# Patient Record
Sex: Female | Born: 2000 | Race: Black or African American | Hispanic: No | Marital: Single | Smoking: Never smoker
Health system: Southern US, Community
[De-identification: ages and names within clinical notes are randomized; demographics above are authoritative.]

## PROBLEM LIST (undated history)

## (undated) HISTORY — PX: NO PAST SURGERIES: SHX2092

## (undated) HISTORY — PX: WISDOM TOOTH EXTRACTION: SHX21

---

## 2019-08-24 ENCOUNTER — Encounter (HOSPITAL_COMMUNITY): Payer: Self-pay

## 2019-08-24 ENCOUNTER — Emergency Department (HOSPITAL_COMMUNITY)
Admission: EM | Admit: 2019-08-24 | Discharge: 2019-08-24 | Disposition: A | Discharge status: Home / Self Care | Payer: Medicaid Other | Attending: Emergency Medicine | Admitting: Emergency Medicine

## 2019-08-24 ENCOUNTER — Other Ambulatory Visit: Payer: Self-pay

## 2019-08-24 ENCOUNTER — Emergency Department (HOSPITAL_COMMUNITY): Payer: Medicaid Other

## 2019-08-24 DIAGNOSIS — Y999 Unspecified external cause status: Secondary | ICD-10-CM | POA: Insufficient documentation

## 2019-08-24 DIAGNOSIS — T148XXA Other injury of unspecified body region, initial encounter: Secondary | ICD-10-CM

## 2019-08-24 DIAGNOSIS — M7918 Myalgia, other site: Secondary | ICD-10-CM | POA: Diagnosis present

## 2019-08-24 DIAGNOSIS — Y9389 Activity, other specified: Secondary | ICD-10-CM | POA: Diagnosis not present

## 2019-08-24 DIAGNOSIS — Y92410 Unspecified street and highway as the place of occurrence of the external cause: Secondary | ICD-10-CM | POA: Insufficient documentation

## 2019-08-24 DIAGNOSIS — M25531 Pain in right wrist: Secondary | ICD-10-CM | POA: Diagnosis not present

## 2019-08-24 MED ORDER — NAPROXEN 500 MG PO TABS: 500.0000 mg | ORAL_TABLET | Freq: Two times a day (BID) | ORAL | 0 refills | Status: DC

## 2019-08-24 MED ORDER — IBUPROFEN 800 MG PO TABS
800.0000 mg | ORAL_TABLET | Freq: Once | ORAL | Status: AC
  Administered 2019-08-24: 800 mg via ORAL
  Filled 2019-08-24: qty 1

## 2019-08-24 NOTE — Discharge Instructions (Signed)
Take naproxen 2 times a day with meals.  Do not take other anti-inflammatories at the same time (Advil, Motrin, ibuprofen, Aleve). You may supplement with Tylenol if you need further pain control. Use muscle cream such as Salonpas, icy hot, BenGay, Biofreeze to help with muscle stiffness and pain. Use ice packs or heating pads if this helps control your pain. You will likely have continued muscle stiffness and soreness over the next couple days.  Follow-up with primary care in 1 week if your symptoms are not improving. Return to the emergency room if you develop vision changes, vomiting, slurred speech, numbness, loss of bowel or bladder control, or any new or worsening symptoms.
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
## 2019-10-04 ENCOUNTER — Other Ambulatory Visit: Payer: Self-pay

## 2019-10-04 ENCOUNTER — Ambulatory Visit (HOSPITAL_COMMUNITY)
Admission: EM | Admit: 2019-10-04 | Discharge: 2019-10-04 | Disposition: A | Discharge status: Home / Self Care | Payer: Medicaid Other | Attending: Family Medicine | Admitting: Family Medicine

## 2019-10-04 ENCOUNTER — Encounter (HOSPITAL_COMMUNITY): Payer: Self-pay | Admitting: Emergency Medicine

## 2019-10-04 DIAGNOSIS — M542 Cervicalgia: Secondary | ICD-10-CM | POA: Diagnosis not present

## 2019-10-04 DIAGNOSIS — M25531 Pain in right wrist: Secondary | ICD-10-CM | POA: Diagnosis not present

## 2019-10-04 MED ORDER — TIZANIDINE HCL 4 MG PO TABS: 4.0000 mg | ORAL_TABLET | Freq: Four times a day (QID) | ORAL | 0 refills | Status: DC | PRN

## 2019-10-04 MED ORDER — IBUPROFEN 800 MG PO TABS: 800.0000 mg | ORAL_TABLET | Freq: Three times a day (TID) | ORAL | 0 refills | Status: DC
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
## 2019-10-04 NOTE — Discharge Instructions (Signed)
Warmth to painful areas Take ibuprofen 3 times a day with food This is an anti-inflammatory pain medication Activity as tolerated Take tizanidine as needed muscle relaxer.  This is useful at bedtime follow-up with orthopedic
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
## 2019-10-14 ENCOUNTER — Ambulatory Visit (HOSPITAL_COMMUNITY)
Admission: EM | Admit: 2019-10-14 | Discharge: 2019-10-14 | Disposition: A | Discharge status: Home / Self Care | Payer: Medicaid Other

## 2019-10-14 ENCOUNTER — Encounter (HOSPITAL_COMMUNITY): Payer: Self-pay

## 2019-10-14 ENCOUNTER — Other Ambulatory Visit: Payer: Self-pay

## 2019-10-14 ENCOUNTER — Ambulatory Visit (INDEPENDENT_AMBULATORY_CARE_PROVIDER_SITE_OTHER): Payer: Medicaid Other

## 2019-10-14 DIAGNOSIS — M79601 Pain in right arm: Secondary | ICD-10-CM | POA: Diagnosis not present

## 2019-10-14 DIAGNOSIS — Z3202 Encounter for pregnancy test, result negative: Secondary | ICD-10-CM

## 2019-10-14 DIAGNOSIS — M79631 Pain in right forearm: Secondary | ICD-10-CM | POA: Diagnosis not present

## 2019-10-14 DIAGNOSIS — M79641 Pain in right hand: Secondary | ICD-10-CM | POA: Diagnosis not present

## 2019-10-14 LAB — POCT PREGNANCY, URINE: Preg Test, Ur: NEGATIVE

## 2019-10-14 LAB — POC URINE PREG, ED: Preg Test, Ur: NEGATIVE
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
## 2019-10-14 NOTE — Discharge Instructions (Signed)
Take the ibuprofen as prescribed.  Rest and elevate your arm.  Apply ice packs 2-3 times a day for up to 20 minutes each.  Wear the Ace wrap as needed for comfort.    Follow up with your primary care provider or an orthopedist if you symptoms continue or worsen;  Or if you develop new symptoms, such as numbness, tingling, or weakness.

## 2019-12-26 ENCOUNTER — Ambulatory Visit (HOSPITAL_COMMUNITY)
Admission: EM | Admit: 2019-12-26 | Discharge: 2019-12-26 | Disposition: A | Discharge status: Home / Self Care | Payer: Medicaid Other | Attending: Family Medicine | Admitting: Family Medicine

## 2019-12-26 ENCOUNTER — Encounter (HOSPITAL_COMMUNITY): Payer: Self-pay

## 2019-12-26 ENCOUNTER — Other Ambulatory Visit: Payer: Self-pay

## 2019-12-26 DIAGNOSIS — R197 Diarrhea, unspecified: Secondary | ICD-10-CM

## 2019-12-26 DIAGNOSIS — B354 Tinea corporis: Secondary | ICD-10-CM

## 2019-12-26 DIAGNOSIS — M2580 Other specified joint disorders, unspecified joint: Secondary | ICD-10-CM

## 2019-12-26 DIAGNOSIS — L2084 Intrinsic (allergic) eczema: Secondary | ICD-10-CM

## 2019-12-26 MED ORDER — OMEPRAZOLE 20 MG PO CPDR: 20.0000 mg | DELAYED_RELEASE_CAPSULE | Freq: Every day | ORAL | 3 refills | Status: DC

## 2019-12-26 MED ORDER — KETOCONAZOLE 2 % EX CREA: 1.0000 "application " | TOPICAL_CREAM | Freq: Two times a day (BID) | CUTANEOUS | 0 refills | Status: DC

## 2019-12-26 MED ORDER — TRIAMCINOLONE ACETONIDE 0.1 % EX CREA: 1.0000 "application " | TOPICAL_CREAM | Freq: Two times a day (BID) | CUTANEOUS | 3 refills | Status: DC

## 2019-12-26 NOTE — ED Triage Notes (Addendum)
Pt presents to UC with abdominal pain and diarrhea since last after she ate form [employer]. Pt reports she may have ringworm in her left arm.

## 2019-12-26 NOTE — ED Provider Notes (Signed)
[facility]    CSN: [id number] Arrival date & time: 12/26/19  1547      History   Chief Complaint Chief Complaint  Patient presents with  . Abdominal Pain  . Diarrhea    HPI Anna Davila is a 19 y.o. female.   [facility] patient visit  Patient comes in complaining of abdominal pain and diarrhea.   Pt presents to UC with abdominal pain and diarrhea since last after she ate form [employer].   No diarrhea x 24 hours.  No cramps or vomiting.  Mild mid abdominal ache.  Pt reports she may have ringworm in her left arm.  This is recurrent.  Began after exposure to pets several days ago    She also would like a refill on her triamcinolone cream for eczema between fingers.  Also, she has tender ball of right great toe x 24 hours.  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  This is a new problem.     History reviewed. No pertinent past medical history.  There are no problems to display for this patient.   History reviewed. No pertinent surgical history.  OB History   No obstetric history on file.      Home Medications    Prior to Admission medications   Medication Sig Start Date End Date Taking? Authorizing Provider  Cholecalciferol 1.25 MG (50000 UT) capsule Take by mouth. 03/17/18   [provider]  ibuprofen (ADVIL) 800 MG tablet Take 1 tablet (800 mg total) by mouth 3 (three) times daily. 10/04/19   Eustace Moore, MD  ketoconazole (NIZORAL) 2 % cream Apply 1 application topically 2 (two) times daily. For ringworm 12/26/19   Elvina Sidle, MD  omeprazole (PRILOSEC) 20 MG capsule Take 1 capsule (20 mg total) by mouth daily. 12/26/19   Elvina Sidle, MD  tiZANidine (ZANAFLEX) 4 MG tablet Take 1-2 tablets (4-8 mg total) by mouth every 6 (six) hours as needed for muscle spasms. 10/04/19   Eustace Moore, MD  triamcinolone cream (KENALOG) 0.1 % Apply 1 application topically 2 (two) times daily. 12/26/19   Elvina Sidle, MD     Family History Family History  Problem Relation Age of Onset  . Healthy Mother   . Hypertension Father     Social History Social History   Tobacco Use  . Smoking status: Never Smoker  . Smokeless tobacco: Never Used  Substance Use Topics  . Alcohol use: Never  . Drug use: Never     Allergies   Shellfish-derived products   Review of Systems Review of Systems  Constitutional: Positive for fatigue. Negative for fever.  HENT: Negative.   Respiratory: Negative.   Cardiovascular: Negative.   Gastrointestinal: Positive for abdominal pain and diarrhea. Negative for nausea and vomiting.  Musculoskeletal: Positive for gait problem.  Skin: Positive for rash.  Neurological: Negative for dizziness and headaches.     Physical Exam Triage Vital Signs ED Triage Vitals [12/26/19 1604]  Enc Vitals Group     BP      Pulse      Resp      Temp      Temp src      SpO2      Weight      Height      Head Circumference      Peak Flow      Pain Score 7     Pain Loc      Pain Edu?  Excl. in [place]?    No data found.  Updated Vital Signs BP 130/76 (BP Location: Left Arm)   Pulse 72   Temp 98.8 F (37.1 C) (Oral)   Resp 18   LMP  (Within Weeks) Comment: 1 week  SpO2 100%   Physical Exam Vitals and nursing note reviewed.  Constitutional:      General: She is not in acute distress.    Appearance: She is well-developed and normal weight.  HENT:     Mouth/Throat:     Mouth: Mucous membranes are moist.  Eyes:     Extraocular Movements: Extraocular movements intact.  Cardiovascular:     Rate and Rhythm: Normal rate.  Pulmonary:     Effort: Pulmonary effort is normal.     Breath sounds: Normal breath sounds.  Abdominal:     General: Abdomen is flat. Bowel sounds are normal.     Palpations: Abdomen is soft.     Tenderness: There is no abdominal tenderness. There is no guarding or rebound.  Skin:    General: Skin is warm and dry.     Comments: 4 mm annular left  forearm papule with central clearing  Dry knuckles on both hands  FROM right foot without erythema but the ball of the foot is tender and pain is reproduced by forced extension of the great toe.  Neurological:     General: No focal deficit present.     Mental Status: She is alert.  Psychiatric:        Mood and Affect: Mood normal.      UC Treatments / Results  Labs (all labs ordered are listed, but only abnormal results are displayed) Labs Reviewed - No data to display  EKG   Radiology No results found.  Procedures Procedures (including critical care time)  Medications Ordered in UC Medications - No data to display  Initial Impression / Assessment and Plan / UC Course  I have reviewed the triage vital signs and the nursing notes.  Pertinent labs & imaging results that were available during my care of the patient were reviewed by me and considered in my medical decision making (see chart for details).     Final Clinical Impressions(s) / UC Diagnoses   Final diagnoses:  Diarrhea, unspecified type  Sesamoiditis  Intrinsic eczema  Tinea corporis     Discharge Instructions     Avoid dairy and fried foods for next day or two.    ED Prescriptions    Medication Sig Dispense Auth. Provider   triamcinolone cream (KENALOG) 0.1 % Apply 1 application topically 2 (two) times daily. 80 g Robyn Haber, MD   ketoconazole (NIZORAL) 2 % cream Apply 1 application topically 2 (two) times daily. For ringworm 30 g Robyn Haber, MD   omeprazole (PRILOSEC) 20 MG capsule Take 1 capsule (20 mg total) by mouth daily. 7 capsule Robyn Haber, MD     I have reviewed the PDMP during this encounter.   Robyn Haber, MD 12/26/19 [phone]

## 2019-12-26 NOTE — Discharge Instructions (Addendum)
Avoid dairy and fried foods for next day or two.

## 2020-05-20 ENCOUNTER — Other Ambulatory Visit: Payer: Self-pay

## 2020-05-20 ENCOUNTER — Ambulatory Visit (HOSPITAL_COMMUNITY)
Admission: EM | Admit: 2020-05-20 | Discharge: 2020-05-20 | Disposition: A | Discharge status: Home / Self Care | Payer: Medicaid Other | Attending: Family Medicine | Admitting: Family Medicine

## 2020-05-20 ENCOUNTER — Encounter (HOSPITAL_COMMUNITY): Payer: Self-pay

## 2020-05-20 DIAGNOSIS — S161XXA Strain of muscle, fascia and tendon at neck level, initial encounter: Secondary | ICD-10-CM | POA: Diagnosis not present

## 2020-05-20 MED ORDER — DICLOFENAC SODIUM 75 MG PO TBEC: 75.0000 mg | DELAYED_RELEASE_TABLET | Freq: Two times a day (BID) | ORAL | 0 refills | Status: DC

## 2020-05-20 MED ORDER — CYCLOBENZAPRINE HCL 10 MG PO TABS: ORAL_TABLET | ORAL | 0 refills | Status: DC
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
## 2020-05-29 ENCOUNTER — Ambulatory Visit (INDEPENDENT_AMBULATORY_CARE_PROVIDER_SITE_OTHER): Payer: Medicaid Other

## 2020-05-29 ENCOUNTER — Encounter (HOSPITAL_COMMUNITY): Payer: Self-pay

## 2020-05-29 ENCOUNTER — Ambulatory Visit (HOSPITAL_COMMUNITY)
Admission: EM | Admit: 2020-05-29 | Discharge: 2020-05-29 | Disposition: A | Discharge status: Home / Self Care | Payer: Medicaid Other | Attending: Family Medicine | Admitting: Family Medicine

## 2020-05-29 ENCOUNTER — Other Ambulatory Visit: Payer: Self-pay

## 2020-05-29 DIAGNOSIS — M25512 Pain in left shoulder: Secondary | ICD-10-CM

## 2020-05-29 MED ORDER — NAPROXEN 500 MG PO TABS: 500.0000 mg | ORAL_TABLET | Freq: Two times a day (BID) | ORAL | 0 refills | Status: DC
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
## 2020-05-29 NOTE — Discharge Instructions (Signed)
Use ice to heat to area Wear sling for a few days ( no more than 3) Remove sling to do exercises for motion 2 x a day See sports medicine if not improving by next week

## 2020-08-20 ENCOUNTER — Encounter: Payer: Self-pay | Admitting: Internal Medicine

## 2020-08-20 ENCOUNTER — Telehealth: Payer: Medicaid Other | Admitting: Internal Medicine

## 2020-08-20 ENCOUNTER — Other Ambulatory Visit: Payer: Self-pay

## 2020-08-20 ENCOUNTER — Ambulatory Visit: Payer: BLUE CROSS/BLUE SHIELD | Attending: Internal Medicine | Admitting: Internal Medicine

## 2020-08-20 VITALS — BP 119/71 | HR 67 | Temp 98.7°F | Resp 16 | Ht 68.0 in | Wt 220.8 lb

## 2020-08-20 DIAGNOSIS — Z79899 Other long term (current) drug therapy: Secondary | ICD-10-CM | POA: Diagnosis not present

## 2020-08-20 DIAGNOSIS — Z791 Long term (current) use of non-steroidal anti-inflammatories (NSAID): Secondary | ICD-10-CM | POA: Insufficient documentation

## 2020-08-20 DIAGNOSIS — G8929 Other chronic pain: Secondary | ICD-10-CM | POA: Diagnosis present

## 2020-08-20 DIAGNOSIS — S161XXA Strain of muscle, fascia and tendon at neck level, initial encounter: Secondary | ICD-10-CM | POA: Insufficient documentation

## 2020-08-20 DIAGNOSIS — Z2821 Immunization not carried out because of patient refusal: Secondary | ICD-10-CM | POA: Diagnosis not present

## 2020-08-20 DIAGNOSIS — M25512 Pain in left shoulder: Secondary | ICD-10-CM | POA: Diagnosis not present

## 2020-08-20 MED ORDER — CYCLOBENZAPRINE HCL 5 MG PO TABS: 5.0000 mg | ORAL_TABLET | Freq: Every evening | ORAL | 1 refills | Status: DC | PRN

## 2020-08-20 MED ORDER — DICLOFENAC SODIUM 1 % EX GEL: 2.0000 g | Freq: Four times a day (QID) | CUTANEOUS | 1 refills | Status: DC

## 2020-08-20 MED ORDER — IBUPROFEN 800 MG PO TABS: 800.0000 mg | ORAL_TABLET | Freq: Three times a day (TID) | ORAL | 1 refills | Status: DC | PRN
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
## 2020-08-20 NOTE — Patient Instructions (Signed)
Try to adjust your workstation to appropriate height so that you are not having to flex your neck and upper back.  Use a heating pad as needed in the evenings.  It would help to loosen the muscles in the upper back.  I have prescribed a muscle relaxant called Flexeril to use as needed.  It can cause some drowsiness.  Do not take it when you have to drive or operate any machinery.

## 2020-11-25 ENCOUNTER — Ambulatory Visit (HOSPITAL_COMMUNITY)
Admission: EM | Admit: 2020-11-25 | Discharge: 2020-11-25 | Disposition: A | Discharge status: Home / Self Care | Payer: Medicaid Other | Attending: Family Medicine | Admitting: Family Medicine

## 2020-11-25 ENCOUNTER — Encounter (HOSPITAL_COMMUNITY): Payer: Self-pay | Admitting: Emergency Medicine

## 2020-11-25 ENCOUNTER — Other Ambulatory Visit: Payer: Self-pay

## 2020-11-25 DIAGNOSIS — G5701 Lesion of sciatic nerve, right lower limb: Secondary | ICD-10-CM

## 2020-11-25 DIAGNOSIS — S161XXA Strain of muscle, fascia and tendon at neck level, initial encounter: Secondary | ICD-10-CM

## 2020-11-25 DIAGNOSIS — R110 Nausea: Secondary | ICD-10-CM

## 2020-11-25 DIAGNOSIS — M25512 Pain in left shoulder: Secondary | ICD-10-CM

## 2020-11-25 DIAGNOSIS — G8929 Other chronic pain: Secondary | ICD-10-CM

## 2020-11-25 LAB — POCT URINALYSIS DIPSTICK, ED / UC
Bilirubin Urine: NEGATIVE
Glucose, UA: NEGATIVE mg/dL
Ketones, ur: NEGATIVE mg/dL
Leukocytes,Ua: NEGATIVE
Nitrite: NEGATIVE
Protein, ur: 30 mg/dL — AB
Specific Gravity, Urine: 1.01 (ref 1.005–1.030)
Urobilinogen, UA: 0.2 mg/dL (ref 0.0–1.0)
pH: 5.5 (ref 5.0–8.0)

## 2020-11-25 LAB — POC URINE PREG, ED: Preg Test, Ur: NEGATIVE

## 2020-11-25 MED ORDER — CYCLOBENZAPRINE HCL 5 MG PO TABS: 5.0000 mg | ORAL_TABLET | Freq: Every evening | ORAL | 0 refills | Status: DC | PRN

## 2020-11-25 MED ORDER — ONDANSETRON 4 MG PO TBDP: 4.0000 mg | ORAL_TABLET | Freq: Three times a day (TID) | ORAL | 0 refills | Status: DC | PRN

## 2020-11-25 MED ORDER — PREDNISONE 20 MG PO TABS: 40.0000 mg | ORAL_TABLET | Freq: Every day | ORAL | 0 refills | Status: DC

## 2020-11-25 NOTE — ED Triage Notes (Signed)
Pt presents today with c/o of right lower back pain that radiates down right buttocks x 2 days. +nausea

## 2020-11-26 NOTE — ED Provider Notes (Signed)
[facility]    CSN: [id number] Arrival date & time: 11/25/20  1853      History   Chief Complaint Chief Complaint  Patient presents with  . Back Pain  . Nausea    HPI Anna Davila is a 20 y.o. female.   Patient presenting today with 1 day history of right low back/hip pain that radiates part way down the back of her right leg.  She states it feels kind of like a shooting pain when she tries to walk and an ache when she is still.  She states this started after she stepped down from a small step wrong at work yesterday.  The area feels very stiff and tight.  No numbness, tingling, swelling, bowel or bladder incontinence, saddle paresthesias.  She also states she ate some chicken wings at work last night and now has some nausea since waking up this morning and has had no appetite.  She has not vomited and has not had any diarrhea at this time.  Denies abdominal pain.  No urinary symptoms or vaginal symptoms.  Has not been trying anything over-the-counter for any of the symptoms.     History reviewed. No pertinent past medical history.  Patient Active Problem List   Diagnosis Date Noted  . Influenza vaccine refused 08/20/2020  . Chronic left shoulder pain 08/20/2020  . Cervical strain 08/20/2020    Past Surgical History:  Procedure Laterality Date  . NO PAST SURGERIES      OB History   No obstetric history on file.      Home Medications    Prior to Admission medications   Medication Sig Start Date End Date Taking? Authorizing Provider  ondansetron (ZOFRAN ODT) 4 MG disintegrating tablet Take 1 tablet (4 mg total) by mouth every 8 (eight) hours as needed for nausea or vomiting. 11/25/20  Yes Particia Nearing, PA-C  predniSONE (DELTASONE) 20 MG tablet Take 2 tablets (40 mg total) by mouth daily with breakfast. 11/25/20  Yes Particia Nearing, PA-C  cyclobenzaprine (FLEXERIL) 5 MG tablet Take 1 tablet (5 mg total) by mouth at bedtime as needed for  muscle spasms. 11/25/20   Particia Nearing, PA-C  diclofenac Sodium (VOLTAREN) 1 % GEL Apply 2 g topically 4 (four) times daily. 08/20/20   Marcine Matar, MD  ibuprofen (ADVIL) 800 MG tablet Take 1 tablet (800 mg total) by mouth every 8 (eight) hours as needed (with food). 08/20/20   Marcine Matar, MD  omeprazole (PRILOSEC) 20 MG capsule Take 1 capsule (20 mg total) by mouth daily. 12/26/19 05/29/20  Elvina Sidle, MD    Family History Family History  Problem Relation Age of Onset  . Healthy Mother   . Hypertension Father     Social History Social History   Tobacco Use  . Smoking status: Never Smoker  . Smokeless tobacco: Never Used  Vaping Use  . Vaping Use: Never used  Substance Use Topics  . Alcohol use: Never  . Drug use: Never     Allergies   Shellfish-derived products   Review of Systems Review of Systems Per HPI Physical Exam Triage Vital Signs ED Triage Vitals  Enc Vitals Group     BP 11/25/20 1957 111/75     Pulse Rate 11/25/20 1957 75     Resp 11/25/20 1957 20     Temp 11/25/20 1957 98.1 F (36.7 C)     Temp Source 11/25/20 1957 Oral     SpO2 11/25/20  1957 100 %     Weight --      Height --      Head Circumference --      Peak Flow --      Pain Score 11/25/20 1953 7     Pain Loc --      Pain Edu? --      Excl. in GC? --    No data found.  Updated Vital Signs BP 111/75 (BP Location: Left Arm)   Pulse 75   Temp 98.1 F (36.7 C) (Oral)   Resp 20   LMP 11/19/2020 (Exact Date)   SpO2 100%   Visual Acuity Right Eye Distance:   Left Eye Distance:   Bilateral Distance:    Right Eye Near:   Left Eye Near:    Bilateral Near:     Physical Exam Vitals and nursing note reviewed.  Constitutional:      Appearance: Normal appearance. She is not ill-appearing.  HENT:     Head: Atraumatic.     Mouth/Throat:     Mouth: Mucous membranes are moist.     Pharynx: Oropharynx is clear.  Eyes:     Extraocular Movements: Extraocular  movements intact.     Conjunctiva/sclera: Conjunctivae normal.  Cardiovascular:     Rate and Rhythm: Normal rate and regular rhythm.     Heart sounds: Normal heart sounds.  Pulmonary:     Effort: Pulmonary effort is normal.     Breath sounds: Normal breath sounds.  Abdominal:     General: Bowel sounds are normal. There is no distension.     Palpations: Abdomen is soft.     Tenderness: There is no abdominal tenderness. There is no right CVA tenderness, left CVA tenderness or guarding.  Musculoskeletal:        General: Tenderness present. No swelling or deformity. Normal range of motion.     Cervical back: Normal range of motion and neck supple.     Comments: Mild right lateral lumbar paraspinal tenderness palpation extending down right lateral hip Negative straight leg raise bilateral lower extremities  Skin:    General: Skin is warm and dry.  Neurological:     Mental Status: She is alert and oriented to person, place, and time.     Comments: Bilateral lower extremities neurovascularly intact  Psychiatric:        Mood and Affect: Mood normal.        Thought Content: Thought content normal.        Judgment: Judgment normal.     UC Treatments / Results  Labs (all labs ordered are listed, but only abnormal results are displayed) Labs Reviewed  POCT URINALYSIS DIPSTICK, ED / UC - Abnormal; Notable for the following components:      Result Value   Hgb urine dipstick MODERATE (*)    Protein, ur 30 (*)    All other components within normal limits  POC URINE PREG, ED    EKG   Radiology No results found.  Procedures Procedures (including critical care time)  Medications Ordered in UC Medications - No data to display  Initial Impression / Assessment and Plan / UC Course  I have reviewed the triage vital signs and the nursing notes.  Pertinent labs & imaging results that were available during my care of the patient were reviewed by me and considered in my medical decision  making (see chart for details).     Suspect piriformis syndrome causing her back pain radiating down part  way to her right leg.  Will treat with prednisone burst, Flexeril, Epsom salt soaks, stretches, heat.  Work note given for some rest.  We will treat her nausea with Zofran for as needed use.  Brat diet, fluids reviewed.  UA, urine pregnancy both negative today.  Return for acutely worsening symptoms.  Final Clinical Impressions(s) / UC Diagnoses   Final diagnoses:  Piriformis syndrome of right side  Nausea   Discharge Instructions   None    ED Prescriptions    Medication Sig Dispense Auth. Provider   predniSONE (DELTASONE) 20 MG tablet Take 2 tablets (40 mg total) by mouth daily with breakfast. 6 tablet Particia Nearing, PA-C   cyclobenzaprine (FLEXERIL) 5 MG tablet Take 1 tablet (5 mg total) by mouth at bedtime as needed for muscle spasms. 15 tablet Particia Nearing, PA-C   ondansetron (ZOFRAN ODT) 4 MG disintegrating tablet Take 1 tablet (4 mg total) by mouth every 8 (eight) hours as needed for nausea or vomiting. 20 tablet Particia Nearing, [place]     PDMP not reviewed this encounter.   Particia Nearing, [place] 11/26/20 2035

## 2020-12-03 ENCOUNTER — Other Ambulatory Visit: Payer: Self-pay

## 2020-12-03 ENCOUNTER — Ambulatory Visit (HOSPITAL_COMMUNITY)
Admission: EM | Admit: 2020-12-03 | Discharge: 2020-12-03 | Disposition: A | Discharge status: Home / Self Care | Payer: Medicaid Other | Attending: Family | Admitting: Family

## 2020-12-03 ENCOUNTER — Encounter (HOSPITAL_COMMUNITY): Payer: Self-pay | Admitting: Emergency Medicine

## 2020-12-03 DIAGNOSIS — T148XXA Other injury of unspecified body region, initial encounter: Secondary | ICD-10-CM

## 2020-12-03 DIAGNOSIS — M7918 Myalgia, other site: Secondary | ICD-10-CM | POA: Diagnosis not present

## 2020-12-03 DIAGNOSIS — M545 Low back pain, unspecified: Secondary | ICD-10-CM

## 2020-12-03 MED ORDER — PREDNISONE 10 MG (21) PO TBPK: ORAL_TABLET | ORAL | 0 refills | Status: DC

## 2020-12-03 NOTE — ED Provider Notes (Signed)
[facility]    CSN: [id number] Arrival date & time: 12/03/20  8657      History   Chief Complaint Chief Complaint  Patient presents with  . Back Pain    HPI Anna Davila is a 20 y.o. female.   20 year old female presents for recheck on right low back and buttock pain that started over 1 week ago. She initially was standing at work on [employer] when she slightly twisted and felt pain in her lower back and buttock. She came to Urgent care on 11/25/20 and was diagnosed with Piriformis syndrome (essentially strain of muscle in buttock) and stayed out of work 2 days. She was placed on 3 days of Prednisone 40mg , Flexeril and Zofran for nausea due to pain. She finished the Prednisone and it was helping some. Has not taken the Flexeril the past 2 nights and having some difficulty sleeping. No longer needs the Zofran. Had used Voltaren gel in the past but not recently. Pain level now is similar to when she first started having pain- gets worse with standing still for hours at work. Better when moving around or sitting. Requests work restrictions for the next week to try to help decrease pain. No other chronic health issues.   The history is provided by the patient.    History reviewed. No pertinent past medical history.  Patient Active Problem List   Diagnosis Date Noted  . Influenza vaccine refused 08/20/2020  . Chronic left shoulder pain 08/20/2020  . Cervical strain 08/20/2020    Past Surgical History:  Procedure Laterality Date  . NO PAST SURGERIES      OB History   No obstetric history on file.      Home Medications    Prior to Admission medications   Medication Sig Start Date End Date Taking? Authorizing Provider  cyclobenzaprine (FLEXERIL) 5 MG tablet Take 1 tablet (5 mg total) by mouth at bedtime as needed for muscle spasms. 11/25/20  Yes 11/27/20, PA-C  predniSONE (STERAPRED UNI-PAK 21 TAB) 10 MG (21) TBPK tablet Take 6 tablets by mouth  today then decrease by 1 tablet each day until finished. 12/03/20  Yes Chi Garlow, [birth year], NP  diclofenac Sodium (VOLTAREN) 1 % GEL Apply 2 g topically 4 (four) times daily. 08/20/20   08/22/20, MD  omeprazole (PRILOSEC) 20 MG capsule Take 1 capsule (20 mg total) by mouth daily. 12/26/19 05/29/20  07/29/20, MD    Family History Family History  Problem Relation Age of Onset  . Healthy Mother   . Hypertension Father     Social History Social History   Tobacco Use  . Smoking status: Never Smoker  . Smokeless tobacco: Never Used  Vaping Use  . Vaping Use: Never used  Substance Use Topics  . Alcohol use: Never  . Drug use: Never     Allergies   Shellfish-derived products   Review of Systems Review of Systems  Constitutional: Positive for activity change. Negative for appetite change, chills, diaphoresis, fatigue and fever.  Respiratory: Negative for chest tightness, shortness of breath and stridor.   Cardiovascular: Negative for chest pain.  Gastrointestinal: Negative for nausea and vomiting.  Genitourinary: Negative for decreased urine volume, difficulty urinating, dysuria and flank pain.  Musculoskeletal: Positive for arthralgias, back pain and myalgias. Negative for gait problem.  Skin: Negative for color change and rash.  Allergic/Immunologic: Positive for environmental allergies and food allergies. Negative for immunocompromised state.  Neurological: Negative for  dizziness, tremors, seizures, syncope, facial asymmetry, speech difficulty, weakness, light-headedness, numbness and headaches.  Hematological: Negative for adenopathy. Does not bruise/bleed easily.  Psychiatric/Behavioral: Positive for sleep disturbance.     Physical Exam Triage Vital Signs ED Triage Vitals  Enc Vitals Group     BP 12/03/20 0822 118/79     Pulse Rate 12/03/20 0822 83     Resp 12/03/20 0822 20     Temp 12/03/20 0822 99 F (37.2 C)     Temp Source 12/03/20 0822 Oral     SpO2  12/03/20 0822 96 %     Weight --      Height --      Head Circumference --      Peak Flow --      Pain Score 12/03/20 0817 7     Pain Loc --      Pain Edu? --      Excl. in GC? --    No data found.  Updated Vital Signs BP 118/79 (BP Location: Left Arm)   Pulse 83   Temp 99 F (37.2 C) (Oral)   Resp 20   LMP 11/19/2020 (Exact Date)   SpO2 96%   Visual Acuity Right Eye Distance:   Left Eye Distance:   Bilateral Distance:    Right Eye Near:   Left Eye Near:    Bilateral Near:     Physical Exam Vitals and nursing note reviewed.  Constitutional:      General: She is awake. She is not in acute distress.    Appearance: She is well-developed and well-groomed.     Comments: She is sitting in the exam chair in no acute distress but is slow to change positions.   HENT:     Head: Normocephalic and atraumatic.     Right Ear: Hearing normal.     Left Ear: Hearing normal.  Eyes:     Extraocular Movements: Extraocular movements intact.     Conjunctiva/sclera: Conjunctivae normal.  Cardiovascular:     Rate and Rhythm: Normal rate and regular rhythm.     Heart sounds: Normal heart sounds. No murmur heard.   Pulmonary:     Effort: Pulmonary effort is normal. No respiratory distress.     Breath sounds: Normal breath sounds and air entry. No decreased air movement. No decreased breath sounds, wheezing, rhonchi or rales.  Musculoskeletal:        General: Tenderness present. No swelling.     Cervical back: Normal range of motion.     Thoracic back: Normal.     Lumbar back: Tenderness present. No swelling or edema. Decreased range of motion. Positive right straight leg raise test. Negative left straight leg raise test. No scoliosis.       Back:     Comments: Decreased range of motion of lower back- mostly with extension and some rotation. No swelling, redness or rash. Slightly tender on right lower lumbar area and upper to mid buttock area. No radiation of pain. No numbness. Good  reflexes and distal pulses. No neuro deficits detected.   Skin:    General: Skin is warm and dry.     Capillary Refill: Capillary refill takes less than 2 seconds.     Findings: No erythema or rash.  Neurological:     General: No focal deficit present.     Mental Status: She is alert and oriented to person, place, and time.     Sensory: Sensation is intact. No sensory deficit.  Motor: Motor function is intact.     Gait: Gait is intact.     Deep Tendon Reflexes: Reflexes are normal and symmetric.  Psychiatric:        Mood and Affect: Mood normal.        Behavior: Behavior normal. Behavior is cooperative.        Thought Content: Thought content normal.        Judgment: Judgment normal.      UC Treatments / Results  Labs (all labs ordered are listed, but only abnormal results are displayed) Labs Reviewed - No data to display  EKG   Radiology No results found.  Procedures Procedures (including critical care time)  Medications Ordered in UC Medications - No data to display  Initial Impression / Assessment and Plan / UC Course  I have reviewed the triage vital signs and the nursing notes.  Pertinent labs & imaging results that were available during my care of the patient were reviewed by me and considered in my medical decision making (see chart for details).    Reviewed with patient that muscle strains can take 2 to 3 weeks to improve and resolve. Discussed that since Prednisone did help some last week, recommend restart Prednisone 10mg  6 day dose pack as directed. Encouraged to take Flexeril 10mg  1/2 or 1 whole tablet at night to help with muscle spasms/pain. Continue to apply heat to area for comfort. May also use OTC Voltaren gel 4 times a day as needed. Continue to stay active- no bed rest. Note written for work with restrictions including no standing for more than 15 minutes at a time, encouraged to sit along with frequent changing of positions. No twisting or reaching  on right side- all for 1 week. Recommend follow-up with her PCP next week (4 to 5 days) if not improving for further evaluation and to complete any other work-related paperwork if needed.  Final Clinical Impressions(s) / UC Diagnoses   Final diagnoses:  Acute right-sided low back pain without sciatica  Right buttock pain  Muscle strain     Discharge Instructions     Recommend restart Prednisone 10mg  tablets- take 6 tablets today then decrease by 1 tablet until finished. May continue Flexeril 10mg  1/2 to 1 whole tablet at night as needed for muscle spasms. Continue to apply heat to area for comfort or may also try OTC Generic Voltaren gel- apply up to 4 times a day as needed to area. Continue to stay active- no bed rest. Follow-up with your PCP if pain continues next week.      ED Prescriptions    Medication Sig Dispense Auth. Provider   predniSONE (STERAPRED UNI-PAK 21 TAB) 10 MG (21) TBPK tablet Take 6 tablets by mouth today then decrease by 1 tablet each day until finished. 21 tablet Jacqulin Brandenburger, , NP     PDMP not reviewed this encounter.   , NP 12/04/20 1110

## 2020-12-03 NOTE — Discharge Instructions (Addendum)
Recommend restart Prednisone 10mg  tablets- take 6 tablets today then decrease by 1 tablet until finished. May continue Flexeril 10mg  1/2 to 1 whole tablet at night as needed for muscle spasms. Continue to apply heat to area for comfort or may also try OTC Generic Voltaren gel- apply up to 4 times a day as needed to area. Continue to stay active- no bed rest. Follow-up with your PCP if pain continues next week.

## 2020-12-03 NOTE — ED Triage Notes (Signed)
Seen 11/25/2020 for the same.  Hurting lower right side of back and right buttocks.  No radiation.  Pain did seem to improve minimally with prednisone, but today's pain is the same as when seen

## 2020-12-29 ENCOUNTER — Telehealth: Payer: Medicaid Other | Admitting: Internal Medicine

## 2021-02-25 ENCOUNTER — Other Ambulatory Visit (HOSPITAL_COMMUNITY)
Admission: RE | Admit: 2021-02-25 | Discharge: 2021-02-25 | Disposition: A | Discharge status: Home / Self Care | Payer: Medicaid Other | Source: Ambulatory Visit | Attending: Internal Medicine | Admitting: Internal Medicine

## 2021-02-25 ENCOUNTER — Other Ambulatory Visit: Payer: Self-pay

## 2021-02-25 ENCOUNTER — Encounter: Payer: Self-pay | Admitting: Internal Medicine

## 2021-02-25 ENCOUNTER — Ambulatory Visit: Payer: Medicaid Other | Attending: Internal Medicine | Admitting: Internal Medicine

## 2021-02-25 VITALS — BP 124/70 | HR 81 | Resp 16 | Wt 227.8 lb

## 2021-02-25 DIAGNOSIS — Z6834 Body mass index (BMI) 34.0-34.9, adult: Secondary | ICD-10-CM | POA: Insufficient documentation

## 2021-02-25 DIAGNOSIS — Z91018 Allergy to other foods: Secondary | ICD-10-CM

## 2021-02-25 DIAGNOSIS — Z113 Encounter for screening for infections with a predominantly sexual mode of transmission: Secondary | ICD-10-CM | POA: Diagnosis present

## 2021-02-25 DIAGNOSIS — E66811 Obesity, class 1: Secondary | ICD-10-CM | POA: Insufficient documentation

## 2021-02-25 DIAGNOSIS — L309 Dermatitis, unspecified: Secondary | ICD-10-CM

## 2021-02-25 DIAGNOSIS — E669 Obesity, unspecified: Secondary | ICD-10-CM | POA: Diagnosis not present

## 2021-02-25 DIAGNOSIS — Z713 Dietary counseling and surveillance: Secondary | ICD-10-CM | POA: Insufficient documentation

## 2021-02-25 DIAGNOSIS — L2089 Other atopic dermatitis: Secondary | ICD-10-CM | POA: Insufficient documentation

## 2021-02-25 MED ORDER — TRIAMCINOLONE ACETONIDE 0.1 % EX CREA: 1.0000 "application " | TOPICAL_CREAM | Freq: Two times a day (BID) | CUTANEOUS | 2 refills | Status: DC

## 2021-02-25 MED ORDER — EPINEPHRINE 0.3 MG/0.3ML IJ SOAJ: 0.3000 mg | INTRAMUSCULAR | 1 refills | Status: AC | PRN

## 2021-02-25 NOTE — Patient Instructions (Signed)

## 2021-02-25 NOTE — Progress Notes (Signed)
Patient ID: Anna Davila, female    DOB: 10/20/2000  MRN: [id number]  CC: std testing, Eczema, Allergy Testing (seafood), and Weight Loss   Subjective: Anna Davila is a 20 y.o. female who presents to discuss several concerns Her concerns today include:   Wants to be screen for STD. No vaginal discgh at this time.  Sexually active with one female partner.  Does not use condoms.    Eczema: dx with eczema in high school.  Currently has breakout on RT hand, anterior chest and LT cubital fossa.  She was using the cream in the past but does not recall the name of it.  She was not using it consistently.  Uses Vaseline consistently.  Wants to be tested for sea food allergy.  Gets itchy throat when she eats fish and shellfish.  Last yr she had episode where she developed swelling of throat and face after eating food that was on display at a buffet right next to seafood..  She did not go to the emergency room.  Instead she took some antihistamine immediately and symptoms resolved.      Obesity: Would like to get her weight down.  Working out 2x/wk over the past mth. Trying to do better with eating habits and amount that she eats.  However she feels that her efforts are not working.  Patient Active Problem List   Diagnosis Date Noted   Influenza vaccine refused 08/20/2020   Chronic left shoulder pain 08/20/2020   Cervical strain 08/20/2020     Current Outpatient Medications on File Prior to Visit  Medication Sig Dispense Refill   cyclobenzaprine (FLEXERIL) 5 MG tablet Take 1 tablet (5 mg total) by mouth at bedtime as needed for muscle spasms. 15 tablet 0   diclofenac Sodium (VOLTAREN) 1 % GEL Apply 2 g topically 4 (four) times daily. 100 g 1   [DISCONTINUED] omeprazole (PRILOSEC) 20 MG capsule Take 1 capsule (20 mg total) by mouth daily. 7 capsule 3   No current facility-administered medications on file prior to visit.    Allergies  Allergen Reactions   Shellfish-Derived Products  Anaphylaxis    All seafood     Social History   Socioeconomic History   Marital status: Significant Other    Spouse name: Not on file   Number of children: 0   Years of education: Not on file   Highest education level: Some college, no degree  Occupational History   Occupation: chicken farm  Tobacco Use   Smoking status: Never   Smokeless tobacco: Never  Vaping Use   Vaping Use: Never used  Substance and Sexual Activity   Alcohol use: Never   Drug use: Never   Sexual activity: Not on file  Other Topics Concern   Not on file  Social History Narrative   Not on file   Social Determinants of Health   Financial Resource Strain: Not on file  Food Insecurity: Not on file  Transportation Needs: Not on file  Physical Activity: Not on file  Stress: Not on file  Social Connections: Not on file  Intimate Partner Violence: Not on file    Family History  Problem Relation Age of Onset   Healthy Mother    Hypertension Father     Past Surgical History:  Procedure Laterality Date   NO PAST SURGERIES      ROS: Review of Systems Negative except as stated above  PHYSICAL EXAM: BP 124/70   Pulse 81   Resp 16  Wt 227 lb 12.8 oz (103.3 kg)   SpO2 98%   BMI 34.64 kg/m   Physical Exam  General appearance - alert, well appearing, obese young African-American female and in no distress Mental status - normal mood, behavior, speech, dress, motor activity, and thought processes Chest - clear to auscultation, no wheezes, rales or rhonchi, symmetric air entry Heart - normal rate, regular rhythm, normal S1, S2, no murmurs, rubs, clicks or gallops Skin -mild hyperpigmented fissuring of the skin noted on the dorsal surface of the right hand, left cubital fossa and the upper anterior chest.   ASSESSMENT AND PLAN: 1. Eczema, unspecified type Discussed the importance of trying to lock in moisture into the skin.  Recommend using Aveeno oatmeal bath packs at least once a week.  She  can continue to use Vaseline.  I have prescribed some triamcinolone cream to use as needed.  Advised not to use it on the face. - triamcinolone cream (KENALOG) 0.1 %; Apply 1 application topically 2 (two) times daily.  Dispense: 30 g; Refill: 2  2. Routine screening for STI (sexually transmitted infection) Encourage use of condoms to help prevent HIV, syphilis and hepatitis - HIV antibody (with reflex) - RPR - Cervicovaginal ancillary only  3. Food allergy Symptoms seem consistent with seafood allergy.  She is agreeable to referral to an allergist.  I have prescribed an EpiPen and have instructed her how to use it in the event she has another episode as she did last year.  Advised that after using the EpiPen she should be seen in the emergency room immediately. - Ambulatory referral to Allergy - EPINEPHrine 0.3 mg/0.3 mL IJ SOAJ injection; Inject 0.3 mg into the muscle as needed for anaphylaxis.  Dispense: 1 each; Refill: 1  4. Obesity (BMI 30.0-34.9) Discussed the importance of healthy eating habits and regular exercise to help with weight loss.  Advised to eliminate sugary drinks from the diet, cut back on portion sizes of white carbohydrates, incorporate fresh fruits and vegetables into the diet every day and eat lean white meat instead of red meat or pork.  Recommend that she gets in at least about 150 minutes/week total of moderate intensity exercise. - Amb ref to Medical Nutrition Therapy-MNT    Patient was given the opportunity to ask questions.  Patient verbalized understanding of the plan and was able to repeat key elements of the plan.   Orders Placed This Encounter  Procedures   HIV antibody (with reflex)   RPR   Ambulatory referral to Allergy   Amb ref to Medical Nutrition Therapy-MNT      Requested Prescriptions   Signed Prescriptions Disp Refills   triamcinolone cream (KENALOG) 0.1 % 30 g 2    Sig: Apply 1 application topically 2 (two) times daily.   EPINEPHrine 0.3  mg/0.3 mL IJ SOAJ injection 1 each 1    Sig: Inject 0.3 mg into the muscle as needed for anaphylaxis.    Return in about 2 months (around 04/27/2021).  Jonah Blue, MD, FACP

## 2021-02-26 ENCOUNTER — Telehealth: Payer: Self-pay

## 2021-02-26 LAB — CERVICOVAGINAL ANCILLARY ONLY
Bacterial Vaginitis (gardnerella): NEGATIVE
Candida Glabrata: NEGATIVE
Candida Vaginitis: NEGATIVE
Chlamydia: NEGATIVE
Comment: NEGATIVE
Comment: NEGATIVE
Comment: NEGATIVE
Comment: NEGATIVE
Comment: NEGATIVE
Comment: NORMAL
Neisseria Gonorrhea: NEGATIVE
Trichomonas: NEGATIVE

## 2021-02-26 NOTE — Telephone Encounter (Signed)
Contacted pt to go over lab results pt didn't answer and was unable to lvm due to vm not being setup  Sent a CRM and forward labs to NT to give pt labs when they call back

## 2021-02-27 ENCOUNTER — Other Ambulatory Visit: Payer: Self-pay | Admitting: Internal Medicine

## 2021-02-27 DIAGNOSIS — A530 Latent syphilis, unspecified as early or late: Secondary | ICD-10-CM

## 2021-02-27 LAB — RPR, QUANT+TP ABS (REFLEX)
Rapid Plasma Reagin, Quant: 1:1 {titer} — ABNORMAL HIGH
T Pallidum Abs: NONREACTIVE

## 2021-02-27 LAB — RPR: RPR Ser Ql: REACTIVE — AB

## 2021-02-27 LAB — HIV ANTIBODY (ROUTINE TESTING W REFLEX): HIV Screen 4th Generation wRfx: NONREACTIVE

## 2021-02-27 NOTE — Progress Notes (Signed)
Screening test for syphilis was positive.  However the subsequent confirmatory test was negative.  Please return to the lab in about 4 weeks to have the confirmatory test repeated one more time.

## 2021-03-02 ENCOUNTER — Telehealth: Payer: Self-pay

## 2021-03-02 NOTE — Telephone Encounter (Signed)
PT called back with some more questions about her test results.  She would like the nurse call her back  Cb#  [phone]

## 2021-03-02 NOTE — Telephone Encounter (Signed)
Contacted pt to go over lab results pt is aware and doesn't have any questions or concerns 

## 2021-03-03 NOTE — Telephone Encounter (Signed)
Phone call placed to patient today.  I verified that I was speaking to the patient with 2 patient identifiers.  Patient tells me she had further questions about the test for syphilis.  She wanted to know whether she needed to be treated.  I went over the results with her.  She had a positive screening RPR but the confirmatory test was negative indicating that this is most likely a false positive screening test.  However it is recommended that the confirmatory test be repeated again in about 1 month to make sure that it stays negative.  If it comes back negative again then nothing further needs to be done.  All questions were answered.  Patient expressed understanding of my explanation.

## 2021-04-06 ENCOUNTER — Ambulatory Visit: Payer: Medicaid Other | Attending: Internal Medicine

## 2021-04-06 ENCOUNTER — Other Ambulatory Visit: Payer: Self-pay

## 2021-04-06 DIAGNOSIS — A530 Latent syphilis, unspecified as early or late: Secondary | ICD-10-CM

## 2021-04-07 ENCOUNTER — Encounter: Payer: Medicaid Other | Attending: Internal Medicine | Admitting: Registered"

## 2021-04-07 ENCOUNTER — Encounter: Payer: Self-pay | Admitting: Registered"

## 2021-04-07 DIAGNOSIS — Z713 Dietary counseling and surveillance: Secondary | ICD-10-CM | POA: Diagnosis present

## 2021-04-07 NOTE — Patient Instructions (Addendum)
-   Decrease intake of sugar-sweetened beverages and increase water intake.  Have 1 can of ginger ale and 2 bottles of water with flavor packs daily  - Eat about every 3-5 hours.   - Increase fruit and vegetable intake with meals.   - Aim to cook more meals at home.   - Check into mental health services provided by [facility] [place] at: [email or web address].

## 2021-04-07 NOTE — Progress Notes (Signed)
Medical Nutrition Therapy  Appointment Start time:  8:48  Appointment End time:  9:48  Primary concerns today: healthy eating habits  Referral diagnosis: obesity Preferred learning style: no preference indicated Learning readiness: ready, change in progress   NUTRITION ASSESSMENT   Pt arrives stating she wants to get help with eating. States she wants to get on a nutrition plan to manage food and portions.   States she has been stressed the last 2 weeks due to body image concerns. States a lot of her stress is due to her over thinking. Reports she does not have a therapist and feels like it may make her more depressed. States she feels isolated at times since pandemic when dynamics of her life changed.   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 States she transferred schools during the pandemic and no longer participated in sports. Reports mom lives in [place] and they do things together sometimes. Has multiple siblings who do not live locally.   States she cooked last night but has mostly been eating fast food. States she likes double Sport and exercise psychologist from [employer] and prefers ginger ale to drink. States she doesn't drink much water, at most a few sips a day. States she knows how to cook.     Clinical Medical Hx: none food-related Medications: See list Labs: none  Notable Signs/Symptoms: none reported  Lifestyle & Dietary Hx  Estimated daily fluid intake: 36-40 oz Supplements: See list Sleep: 7-8 hours Stress / self-care: used to go the gym,  Current average weekly physical activity: gym - strength training, cardio 60 min, 3x/week but hasn't been in the last 2 weeks  24-Hr Dietary Recall First Meal: bacon, egg, cheese biscuit + grits Snack:  Second Meal: skips Snack:  Third Meal (7 pm): french fries + chicken fajita (chicken, peppers/onions, tomatoes, cheese) Snack:  Beverages:  ginger ale (3*12 oz; 36 oz), water (a few sips)   NUTRITION DIAGNOSIS  NB-1.1 Food and nutrition-related knowledge deficit As related to balanced eating.  As evidenced by dietary recall.   NUTRITION INTERVENTION  Nutrition education (E-1) on the following topics: Nutrition education and counseling. Pt was educated on the benefits of eating a variety of food groups at each meal. Discussed the purpose of each food group and ways to create balance with already established regimen. Discussed eating every 3-5 hours to help adequately nourish body and ways to increase water intake. Discussed importance of physical activity. Pt agreed with goals listed.   Handouts Provided Include  Start Simple with My Plate  Learning Style & Readiness for Change Teaching method utilized: Visual & Auditory  Demonstrated degree of understanding via: Teach Back  Barriers to learning/adherence to lifestyle change: none identified  Goals Established by Pt - Decrease intake of sugar-sweetened beverages and increase water intake.  Have 1 can of ginger ale and 2 bottles of water with flavor packs daily Eat about every 3-5 hours.  Increase fruit and vegetable intake with meals.  Aim to cook more meals at home.  Check into mental health services provided by [facility] at: [email or web address].    MONITORING & EVALUATION Dietary intake, weekly physical activity.  Next Steps  Patient is to follow-up prn.

## 2021-04-08 LAB — T.PALLIDUM AB, TOTAL: T Pallidum Abs: NONREACTIVE

## 2021-04-14 ENCOUNTER — Telehealth: Payer: Self-pay

## 2021-04-14 NOTE — Telephone Encounter (Signed)
Pt was called and a VM was left informing patient of lab results. 

## 2021-04-14 NOTE — Telephone Encounter (Signed)
-----   Message from Marcine Matar, MD sent at 04/08/2021  8:01 AM EDT ----- Let patient know that repeat screening test for syphilis is negative.  No further actions needed at this time.

## 2021-04-21 NOTE — Progress Notes (Signed)
New Patient Note  RE: Anna Davila MRN: [id number] DOB: Jul 16, 2001 Date of Office Visit: 04/22/2021  Consult requested by: Marcine Matar, MD Primary care provider: Marcine Matar, MD  Chief Complaint: Allergy Testing  History of Present Illness: I had the pleasure of seeing Anna Davila for initial evaluation at the [facility] on 04/22/2021. She is a 20 y.o. female, who is referred here by Marcine Matar, MD for the evaluation of food allergy.  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  The last reaction occurred last year, after she ate at a [place] buffet with sesame chicken, broccoli, noodles. She took 2 bites and her throat started to swell. She thinks there was cross contamination with salmon.   Symptoms started within minutes and was in the form of throat swelling, wheezing, facial itching/swelling.  First reaction occurred as a child after eating fish and she had some throat/tongue itching.   She also had lip swelling with a soup that had shrimp in it in the past.   Denies any associated cofactors such as exertion, infection, NSAID use. The symptoms lasted for a few hours after taking benadryl. She was never evaluated in ED. She does have access to epinephrine autoinjector and not needed to use it.   Past work up includes: none. Dietary History: patient has been eating other foods including milk, eggs, peanut, treenuts, sesame, soy, wheat, meats, fruits and vegetables.  She reports reading labels and avoiding seafood in diet completely.   02/25/2021 PCP visit: "Wants to be tested for sea food allergy.  Gets itchy throat when she eats fish and shellfish.  Last yr she had episode where she developed swelling of throat and face after eating food that was on display at a buffet right next to seafood..  She did not go to the emergency room.  Instead she took some antihistamine immediately and symptoms resolved."  Assessment and Plan: Anna Davila is a  20 y.o. female with: Anaphylactic reaction due to other food products, subsequent encounter Reaction to fish as a child with throat and tongue itching.  More recently went to a [place] buffet and think she had cross-contamination with salmon.  Noted throat swelling, wheezing and facial itching/swelling.  Took Benadryl and symptoms resolved within a few hours.  Mentions lip swelling after eating soup with shrimp in it.  No prior allergy work-up. Today's skin testing showed: Positive to finned fish. Negative to shellfish and other commons foods. Results given. Start strict avoidance of seafood. Get bloodwork and if favorable will schedule for lobster challenge in the office. Epinephrine injectable device - demonstrated proper use. For mild symptoms you can take over the counter antihistamines such as Benadryl and monitor symptoms closely. If symptoms worsen or if you have severe symptoms including breathing issues, throat closure, significant swelling, whole body hives, severe diarrhea and vomiting, lightheadedness then inject epinephrine and seek immediate medical care afterwards. Emergency action plan given.  Other allergic rhinitis Rhinoconjunctivitis symptoms in the spring.  Usually takes over-the-counter antihistamines with good benefit. Today's skin prick testing showed: Positive to grass, trees, dust mites, cat. Start environmental control measures as below. Not interested in AIT at this time - will need intradermal testing if interested in pursuing. Use over the counter antihistamines such as Zyrtec (cetirizine), Claritin (loratadine), Allegra (fexofenadine), or Xyzal (levocetirizine) daily as needed. May take twice a day during allergy flares. May switch antihistamines every few months. Use cromolyn 4% 1 drop in each eye up to  four times a day as needed for itchy/watery eyes.  Use Flonase (fluticasone) nasal spray 1 spray per nostril twice a day as needed for nasal congestion.  Nasal saline  spray (i.e., Simply Saline) or nasal saline lavage (i.e., NeilMed) is recommended as needed and prior to medicated nasal sprays.  Other atopic dermatitis Dry patches on the hands and antecubital fossa area.  Uses triamcinolone cream with some benefit. See below for proper skincare. Use Eucrisa (crisaborole) 2% ointment twice a day on mild rash flares on the face and body. This is a non-steroid ointment. Samples given.  Use triamcinolone 0.1% ointment twice a day as needed for rash flares. Do not use on the face, neck, armpits or groin area. Do not use more than 3 weeks in a row.  Allergic conjunctivitis of both eyes See assessment and plan as above.  Return in about 6 months (around 10/23/2021).  Meds ordered this encounter  Medications   fluticasone (FLONASE) 50 MCG/ACT nasal spray    Sig: Place 1 spray into both nostrils 2 (two) times daily as needed (nasal congestion).    Dispense:  16 g    Refill:  5   cromolyn (OPTICROM) 4 % ophthalmic solution    Sig: Place 1 drop into both eyes 4 (four) times daily as needed (itchy/watery eyes).    Dispense:  10 mL    Refill:  3   levocetirizine (XYZAL) 5 MG tablet    Sig: Take 1 tablet (5 mg total) by mouth every evening.    Dispense:  30 tablet    Refill:  5   Crisaborole (EUCRISA) 2 % OINT    Sig: Apply 1 application topically 2 (two) times daily as needed (mild rash).    Dispense:  60 g    Refill:  5    Lab Orders         Allergen Profile, Food-Fish         Allergen Profile, Shellfish      Other allergy screening: Asthma: no Rhino conjunctivitis: yes Sneezing, itchy eyes mainly in the spring. Usually takes OTC antihistamines with good benefit. No prior allergy testing.  Medication allergy: no Hymenoptera allergy: no Urticaria: no Eczema: dry patches on the hands and antecubital fossa area. Uses triamcinolone cream prn with good benefit.  History of recurrent infections suggestive of immunodeficency: no  Diagnostics: Skin  Testing: Environmental allergy panel and select foods. Positive to grass, trees, dust mites, cat. Positive to finned fish. Negative to shellfish and other commons foods. Results discussed with patient/family.  Airborne Adult Perc - 04/22/21 0952     Time Antigen Placed 0950    Allergen Manufacturer Waynette Buttery    Location Back    Number of Test 59    Panel 1 Select    1. Control-Buffer 50% Glycerol Negative    2. Control-Histamine 1 mg/ml 2+    3. Albumin saline Negative    4. Bahia 4+    5. French Southern Territories Negative    6. Johnson 3+    7. Kentucky Blue 3+    8. Meadow Fescue 3+    9. Perennial Rye 4+    10. Sweet Vernal 3+    11. Timothy 2+    12. Cocklebur Negative    13. Burweed Marshelder Negative    14. Ragweed, short Negative    15. Ragweed, Giant Negative    16. Plantain,  English Negative    17. Lamb's Quarters Negative    18. Sheep Sorrell Negative  19. Rough Pigweed Negative    20. Marsh Elder, Rough Negative    21. Mugwort, Common Negative    22. Ash mix Negative    23. Birch mix Negative    24. Beech American Negative    25. Box, Elder 2+    26. Cedar, red 2+    27. Cottonwood, Guinea-BissauEastern Negative    28. Elm mix Negative    29. Hickory 2+    30. Maple mix 2+    31. Oak, Guinea-BissauEastern mix 2+    32. Pecan Pollen 2+    33. Pine mix Negative    34. Sycamore Eastern Negative    35. Walnut, Black Pollen Negative    36. Alternaria alternata Negative    37. Cladosporium Herbarum Negative    38. Aspergillus mix Negative    39. Penicillium mix Negative    40. Bipolaris sorokiniana (Helminthosporium) Negative    41. Drechslera spicifera (Curvularia) Negative    42. Mucor plumbeus Negative    43. Fusarium moniliforme Negative    44. Aureobasidium pullulans (pullulara) Negative    45. Rhizopus oryzae Negative    46. Botrytis cinera Negative    47. Epicoccum nigrum Negative    48. Phoma betae Negative    49. Candida Albicans Negative    50. Trichophyton mentagrophytes Negative     51. Mite, D Farinae  5,000 AU/ml 2+    52. Mite, D Pteronyssinus  5,000 AU/ml 2+    53. Cat Hair 10,000 BAU/ml 2+    54.  Dog Epithelia Negative    55. Mixed Feathers Negative    56. Horse Epithelia Negative    57. Cockroach, German Negative    58. Mouse Negative    59. Tobacco Leaf Negative             Food Adult Perc - 04/22/21 0900     Time Antigen Placed [id number]    Allergen Manufacturer Waynette ButteryGreer    Location Back    Number of allergen test 21    1. Peanut Negative    2. Soybean Negative    3. Wheat Negative    4. Sesame Negative    5. Milk, cow Negative    6. Egg White, Chicken Negative    7. Casein Negative    8. Shellfish Mix Negative    9. Fish Mix Negative    18. Catfish --   9x6   19. Bass --   16x6   20. Trout --   13x5   21. Tuna Negative    22. Salmon --   10x4   23. Flounder --   12x7   24. Codfish --   9x7   25. Shrimp Negative    26. Crab Negative    27. Lobster Negative    28. Oyster Negative    29. Scallops Negative             Past Medical History: Patient Active Problem List   Diagnosis Date Noted   Anaphylactic reaction due to other food products, subsequent encounter 04/22/2021   Other allergic rhinitis 04/22/2021   Allergic conjunctivitis of both eyes 04/22/2021   Obesity (BMI 30.0-34.9) 02/25/2021   Food allergy 02/25/2021   Other atopic dermatitis 02/25/2021   Influenza vaccine refused 08/20/2020   Chronic left shoulder pain 08/20/2020   Cervical strain 08/20/2020   History reviewed. No pertinent past medical history. Past Surgical History: Past Surgical History:  Procedure Laterality Date   NO PAST SURGERIES  WISDOM TOOTH EXTRACTION Bilateral    Medication List:  Current Outpatient Medications  Medication Sig Dispense Refill   Crisaborole (EUCRISA) 2 % OINT Apply 1 application topically 2 (two) times daily as needed (mild rash). 60 g 5   cromolyn (OPTICROM) 4 % ophthalmic solution Place 1 drop into both eyes 4 (four) times  daily as needed (itchy/watery eyes). 10 mL 3   EPINEPHrine 0.3 mg/0.3 mL IJ SOAJ injection Inject 0.3 mg into the muscle as needed for anaphylaxis. 1 each 1   fluticasone (FLONASE) 50 MCG/ACT nasal spray Place 1 spray into both nostrils 2 (two) times daily as needed (nasal congestion). 16 g 5   levocetirizine (XYZAL) 5 MG tablet Take 1 tablet (5 mg total) by mouth every evening. 30 tablet 5   cyclobenzaprine (FLEXERIL) 5 MG tablet Take 1 tablet (5 mg total) by mouth at bedtime as needed for muscle spasms. (Patient not taking: No sig reported) 15 tablet 0   diclofenac Sodium (VOLTAREN) 1 % GEL Apply 2 g topically 4 (four) times daily. (Patient not taking: No sig reported) 100 g 1   triamcinolone cream (KENALOG) 0.1 % Apply 1 application topically 2 (two) times daily. (Patient not taking: No sig reported) 30 g 2   No current facility-administered medications for this visit.   Allergies: Allergies  Allergen Reactions   Shellfish-Derived Products Anaphylaxis    All seafood    Fish Allergy    Social History: Social History   Socioeconomic History   Marital status: Single    Spouse name: Not on file   Number of children: 0   Years of education: Not on file   Highest education level: Some college, no degree  Occupational History   Occupation: chicken farm  Tobacco Use   Smoking status: Never   Smokeless tobacco: Never  Vaping Use   Vaping Use: Never used  Substance and Sexual Activity   Alcohol use: Never   Drug use: Never   Sexual activity: Not on file  Other Topics Concern   Not on file  Social History Narrative   Not on file   Social Determinants of Health   Financial Resource Strain: Not on file  Food Insecurity: No Food Insecurity   Worried About Programme researcher, broadcasting/film/video in the Last Year: Never true   Ran Out of Food in the Last Year: Never true  Transportation Needs: Not on file  Physical Activity: Not on file  Stress: Not on file  Social Connections: Not on file    Lives in an apartment. Smoking: denies Occupation: Research officer, political party HistorySurveyor, minerals in the house: no Engineer, civil (consulting) in the family room: yes Carpet in the bedroom: yes Heating: electric Cooling: central Pet: no  Family History: Family History  Problem Relation Age of Onset   Healthy Mother    Hypertension Father    Eczema Sister    Diabetes Other    Problem                               Relation Asthma                                   No  Eczema  Sister  Food allergy                          No  Allergic rhino conjunctivitis     No  Review of Systems  Constitutional:  Negative for appetite change, chills, fever and unexpected weight change.  HENT:  Negative for congestion and rhinorrhea.   Eyes:  Negative for itching.  Respiratory:  Negative for cough, chest tightness, shortness of breath and wheezing.   Cardiovascular:  Negative for chest pain.  Gastrointestinal:  Negative for abdominal pain.  Genitourinary:  Negative for difficulty urinating.  Skin:  Positive for rash.  Allergic/Immunologic: Positive for environmental allergies and food allergies.  Neurological:  Negative for headaches.   Objective: BP 122/74   Pulse 80   Temp 98.2 F (36.8 C) (Temporal)   Resp 16   Ht 5\' 8"  (1.727 m)   Wt 225 lb 4 oz (102.2 kg)   SpO2 99%   BMI 34.25 kg/m  Body mass index is 34.25 kg/m. Physical Exam Vitals and nursing note reviewed.  Constitutional:      Appearance: Normal appearance. She is well-developed.  HENT:     Head: Normocephalic and atraumatic.     Right Ear: Tympanic membrane and external ear normal.     Left Ear: Tympanic membrane and external ear normal.     Nose: Nose normal.     Mouth/Throat:     Mouth: Mucous membranes are moist.     Pharynx: Oropharynx is clear.  Eyes:     Conjunctiva/sclera: Conjunctivae normal.  Cardiovascular:     Rate and Rhythm: Normal rate and regular rhythm.     Heart sounds:  Normal heart sounds. No murmur heard.   No friction rub. No gallop.  Pulmonary:     Effort: Pulmonary effort is normal.     Breath sounds: Normal breath sounds. No wheezing, rhonchi or rales.  Musculoskeletal:     Cervical back: Neck supple.  Skin:    General: Skin is warm and dry.     Findings: No rash.     Comments: Dry patches on the dorsal aspect of hands b/l and antecubital fossa b/l.  Neurological:     Mental Status: She is alert and oriented to person, place, and time.  Psychiatric:        Behavior: Behavior normal.  The plan was reviewed with the patient/family, and all questions/concerned were addressed.  It was my pleasure to see Anna Davila today and participate in her care. Please feel free to contact me with any questions or concerns.  Sincerely,  Pearson Grippe, DO Allergy & Immunology  [facility] office: [phone] [facility] office: [phone]

## 2021-04-22 ENCOUNTER — Other Ambulatory Visit: Payer: Self-pay

## 2021-04-22 ENCOUNTER — Ambulatory Visit (INDEPENDENT_AMBULATORY_CARE_PROVIDER_SITE_OTHER): Payer: Medicaid Other | Admitting: Allergy

## 2021-04-22 ENCOUNTER — Encounter: Payer: Self-pay | Admitting: Allergy

## 2021-04-22 VITALS — BP 122/74 | HR 80 | Temp 98.2°F | Resp 16 | Ht 68.0 in | Wt 225.2 lb

## 2021-04-22 DIAGNOSIS — L2089 Other atopic dermatitis: Secondary | ICD-10-CM | POA: Diagnosis not present

## 2021-04-22 DIAGNOSIS — J3089 Other allergic rhinitis: Secondary | ICD-10-CM | POA: Diagnosis not present

## 2021-04-22 DIAGNOSIS — H1013 Acute atopic conjunctivitis, bilateral: Secondary | ICD-10-CM | POA: Diagnosis not present

## 2021-04-22 DIAGNOSIS — T7809XD Anaphylactic reaction due to other food products, subsequent encounter: Secondary | ICD-10-CM

## 2021-04-22 MED ORDER — CROMOLYN SODIUM 4 % OP SOLN: 1.0000 [drp] | Freq: Four times a day (QID) | OPHTHALMIC | 3 refills | Status: DC | PRN

## 2021-04-22 MED ORDER — EUCRISA 2 % EX OINT: 1.0000 "application " | TOPICAL_OINTMENT | Freq: Two times a day (BID) | CUTANEOUS | 5 refills | Status: DC | PRN

## 2021-04-22 MED ORDER — FLUTICASONE PROPIONATE 50 MCG/ACT NA SUSP: 1.0000 | Freq: Two times a day (BID) | NASAL | 5 refills | Status: DC | PRN

## 2021-04-22 MED ORDER — LEVOCETIRIZINE DIHYDROCHLORIDE 5 MG PO TABS: 5.0000 mg | ORAL_TABLET | Freq: Every evening | ORAL | 5 refills | Status: DC

## 2021-04-22 NOTE — Assessment & Plan Note (Signed)
Reaction to fish as a child with throat and tongue itching.  More recently went to a [place] buffet and think she had cross-contamination with salmon.  Noted throat swelling, wheezing and facial itching/swelling.  Took Benadryl and symptoms resolved within a few hours.  Mentions lip swelling after eating soup with shrimp in it.  No prior allergy work-up.  Today's skin testing showed: Positive to finned fish. Negative to shellfish and other commons foods. Results given.  Start strict avoidance of seafood. . Get bloodwork and if favorable will schedule for lobster challenge in the office.  Epinephrine injectable device - demonstrated proper use. For mild symptoms you can take over the counter antihistamines such as Benadryl and monitor symptoms closely. If symptoms worsen or if you have severe symptoms including breathing issues, throat closure, significant swelling, whole body hives, severe diarrhea and vomiting, lightheadedness then inject epinephrine and seek immediate medical care afterwards.  Emergency action plan given.

## 2021-04-22 NOTE — Patient Instructions (Addendum)
Today's skin testing showed: Positive to grass, trees, dust mites, cat. Positive to finned fish. Negative to shellfish and other commons foods. Results given.  Food allergies Start strict avoidance of seafood. Get bloodwork We are ordering labs, so please allow 1-2 weeks for the results to come back. With the newly implemented Cures Act, the labs might be visible to you at the same time that they become visible to me. However, I will not address the results until all of the results are back, so please be patient.  In the meantime, continue recommendations in your patient instructions, including avoidance measures (if applicable), until you hear from me.  Epinephrine injectable device - demonstrated proper use. For mild symptoms you can take over the counter antihistamines such as Benadryl and monitor symptoms closely. If symptoms worsen or if you have severe symptoms including breathing issues, throat closure, significant swelling, whole body hives, severe diarrhea and vomiting, lightheadedness then inject epinephrine and seek immediate medical care afterwards. Emergency action plan given.  Environmental control measures: Use over the counter antihistamines such as Zyrtec (cetirizine), Claritin (loratadine), Allegra (fexofenadine), or Xyzal (levocetirizine) daily as needed. May take twice a day during allergy flares. May switch antihistamines every few months. Use cromolyn 4% 1 drop in each eye up to four times a day as needed for itchy/watery eyes.  Use Flonase (fluticasone) nasal spray 1 spray per nostril twice a day as needed for nasal congestion.  Nasal saline spray (i.e., Simply Saline) or nasal saline lavage (i.e., NeilMed) is recommended as needed and prior to medicated nasal sprays.  Eczema: See below for proper skincare. Use Eucrisa (crisaborole) 2% ointment twice a day on mild rash flares on the face and body. This is a non-steroid ointment.  If it burns, place the medication in the  refrigerator.  Apply a thin layer of moisturizer and then apply the Eucrisa on top of it.  Use triamcinolone 0.1% ointment twice a day as needed for rash flares. Do not use on the face, neck, armpits or groin area. Do not use more than 3 weeks in a row.   Follow up in 6 months or sooner if needed.    Skin care recommendations  Bath time: Always use lukewarm water. AVOID very hot or cold water. Keep bathing time to 5-10 minutes. Do NOT use bubble bath. Use a mild soap and use just enough to wash the dirty areas. Do NOT scrub skin vigorously.  After bathing, pat dry your skin with a towel. Do NOT rub or scrub the skin.  Moisturizers and prescriptions:  ALWAYS apply moisturizers immediately after bathing (within 3 minutes). This helps to lock-in moisture. Use the moisturizer several times a day over the whole body. Good summer moisturizers include: Aveeno, CeraVe, Cetaphil. Good winter moisturizers include: Aquaphor, Vaseline, Cerave, Cetaphil, Eucerin, Vanicream. When using moisturizers along with medications, the moisturizer should be applied about one hour after applying the medication to prevent diluting effect of the medication or moisturize around where you applied the medications. When not using medications, the moisturizer can be continued twice daily as maintenance.  Laundry and clothing: Avoid laundry products with added color or perfumes. Use unscented hypo-allergenic laundry products such as Tide free, Cheer free & gentle, and All free and clear.  If the skin still seems dry or sensitive, you can try double-rinsing the clothes. Avoid tight or scratchy clothing such as wool. Do not use fabric softeners or dyer sheets.   Reducing Pollen Exposure Pollen seasons: trees (spring), grass (summer)  and ragweed/weeds (fall). Keep windows closed in your home and car to lower pollen exposure.  Install air conditioning in the bedroom and throughout the house if possible.  Avoid going  out in dry windy days - especially early morning. Pollen counts are highest between 5 - 10 AM and on dry, hot and windy days.  Save outside activities for late afternoon or after a heavy rain, when pollen levels are lower.  Avoid mowing of grass if you have grass pollen allergy. Be aware that pollen can also be transported indoors on people and pets.  Dry your clothes in an automatic dryer rather than hanging them outside where they might collect pollen.  Rinse hair and eyes before bedtime. Control of House Dust Mite Allergen Dust mite allergens are a common trigger of allergy and asthma symptoms. While they can be found throughout the house, these microscopic creatures thrive in warm, humid environments such as bedding, upholstered furniture and carpeting. Because so much time is spent in the bedroom, it is essential to reduce mite levels there.  Encase pillows, mattresses, and box springs in special allergen-proof fabric covers or airtight, zippered plastic covers.  Bedding should be washed weekly in hot water (130 F) and dried in a hot dryer. Allergen-proof covers are available for comforters and pillows that can't be regularly washed.  Wash the allergy-proof covers every few months. Minimize clutter in the bedroom. Keep pets out of the bedroom.  Keep humidity less than 50% by using a dehumidifier or air conditioning. You can buy a humidity measuring device called a hygrometer to monitor this.  If possible, replace carpets with hardwood, linoleum, or washable area rugs. If that's not possible, vacuum frequently with a vacuum that has a HEPA filter. Remove all upholstered furniture and non-washable window drapes from the bedroom. Remove all non-washable stuffed toys from the bedroom.  Wash stuffed toys weekly. Pet Allergen Avoidance: Contrary to popular opinion, there are no "hypoallergenic" breeds of dogs or cats. That is because people are not allergic to an animal's hair, but to an allergen  found in the animal's saliva, dander (dead skin flakes) or urine. Pet allergy symptoms typically occur within minutes. For some people, symptoms can build up and become most severe 8 to 12 hours after contact with the animal. People with severe allergies can experience reactions in public places if dander has been transported on the pet owners' clothing. Keeping an animal outdoors is only a partial solution, since homes with pets in the yard still have higher concentrations of animal allergens. Before getting a pet, ask your allergist to determine if you are allergic to animals. If your pet is already considered part of your family, try to minimize contact and keep the pet out of the bedroom and other rooms where you spend a great deal of time. As with dust mites, vacuum carpets often or replace carpet with a hardwood floor, tile or linoleum. High-efficiency particulate air (HEPA) cleaners can reduce allergen levels over time. While dander and saliva are the source of cat and dog allergens, urine is the source of allergens from rabbits, hamsters, mice and [place] pigs; so ask a non-allergic family member to clean the animal's cage. If you have a pet allergy, talk to your allergist about the potential for allergy immunotherapy (allergy shots). This strategy can often provide long-term relief.

## 2021-04-22 NOTE — Assessment & Plan Note (Signed)
Rhinoconjunctivitis symptoms in the spring.  Usually takes over-the-counter antihistamines with good benefit.  Today's skin prick testing showed: Positive to grass, trees, dust mites, cat.  Start environmental control measures as below.  Not interested in AIT at this time - will need intradermal testing if interested in pursuing. . Use over the counter antihistamines such as Zyrtec (cetirizine), Claritin (loratadine), Allegra (fexofenadine), or Xyzal (levocetirizine) daily as needed. May take twice a day during allergy flares. May switch antihistamines every few months. . Use cromolyn 4% 1 drop in each eye up to four times a day as needed for itchy/watery eyes.  . Use Flonase (fluticasone) nasal spray 1 spray per nostril twice a day as needed for nasal congestion.  . Nasal saline spray (i.e., Simply Saline) or nasal saline lavage (i.e., NeilMed) is recommended as needed and prior to medicated nasal sprays.

## 2021-04-22 NOTE — Assessment & Plan Note (Signed)
Dry patches on the hands and antecubital fossa area.  Uses triamcinolone cream with some benefit. . See below for proper skincare.  Use Eucrisa (crisaborole) 2% ointment twice a day on mild rash flares on the face and body. This is a non-steroid ointment. Samples given.  . Use triamcinolone 0.1% ointment twice a day as needed for rash flares. Do not use on the face, neck, armpits or groin area. Do not use more than 3 weeks in a row.

## 2021-04-22 NOTE — Assessment & Plan Note (Signed)
.   See assessment and plan as above. 

## 2021-05-19 ENCOUNTER — Encounter (HOSPITAL_COMMUNITY): Payer: Self-pay

## 2021-05-19 ENCOUNTER — Other Ambulatory Visit: Payer: Self-pay

## 2021-05-19 ENCOUNTER — Ambulatory Visit (HOSPITAL_COMMUNITY)
Admission: EM | Admit: 2021-05-19 | Discharge: 2021-05-19 | Disposition: A | Discharge status: Home / Self Care | Payer: Medicaid Other | Attending: Family Medicine | Admitting: Family Medicine

## 2021-05-19 DIAGNOSIS — M545 Low back pain, unspecified: Secondary | ICD-10-CM | POA: Diagnosis not present

## 2021-05-19 DIAGNOSIS — M25562 Pain in left knee: Secondary | ICD-10-CM | POA: Diagnosis not present

## 2021-05-19 MED ORDER — DICLOFENAC SODIUM 75 MG PO TBEC: 75.0000 mg | DELAYED_RELEASE_TABLET | Freq: Two times a day (BID) | ORAL | 0 refills | Status: DC

## 2021-05-19 MED ORDER — PREDNISONE 20 MG PO TABS: 40.0000 mg | ORAL_TABLET | Freq: Every day | ORAL | 0 refills | Status: DC

## 2021-05-19 NOTE — ED Triage Notes (Signed)
Pt c/o lt knee pain and lower back pain for 3wks. Denies injury.

## 2021-05-22 NOTE — ED Provider Notes (Addendum)
[facility]   [id number] 05/19/21 Arrival Time: 1750  ASSESSMENT & PLAN:  1. Acute right-sided low back pain without sciatica   2. Acute pain of left knee    Able to ambulate here and hemodynamically stable. No indication for imaging of back at this time given no trauma and normal neurological exam. Discussed.  Meds ordered this encounter  Medications   diclofenac (VOLTAREN) 75 MG EC tablet    Sig: Take 1 tablet (75 mg total) by mouth 2 (two) times daily.    Dispense:  14 tablet    Refill:  0   predniSONE (DELTASONE) 20 MG tablet    Sig: Take 2 tablets (40 mg total) by mouth daily.    Dispense:  10 tablet    Refill:  0   Medication sedation precautions given.  Recommend:  Follow-up Information     [facility].   Why: If worsening or failing to improve as anticipated. Contact information: [address] [place] [address] [phone]                Reviewed expectations re: course of current medical issues. Questions answered. Outlined signs and symptoms indicating need for more acute intervention. Patient verbalized understanding. After Visit Summary given.   SUBJECTIVE: History from: patient.  Anna Davila is a 20 y.o. female who presents with complaint of intermittent low back pain; grad onset; past 3 weeks; now L knee hurting. No injury/trauma. History of back problems requiring medical care: no. Pain described as aching and without radiation. Aggravating factors: certain movements and prolonged walking/standing. Alleviating factors: rest. Progressive LE weakness or saddle anesthesia: none. Extremity sensation changes or weakness: none. Ambulatory without difficulty. Normal bowel/bladder habits: yes; without urinary retention. Normal PO intake without n/v. No associated abdominal pain/n/v. OTC analgesics without much relief.   OBJECTIVE:  Vitals:   05/19/21 1858  BP: 119/73  Pulse: 65   Resp: 18  Temp: 99.2 F (37.3 C)  TempSrc: Oral    General appearance: alert; no distress HEENT: [place]; AT Neck: supple with FROM; without midline tenderness CV: regular Lungs: unlabored respirations; speaks full sentences without difficulty Abdomen: soft, non-tender; non-distended Back: mild  and poorly localized tenderness to palpation over R paraspinal musculature ; FROM at waist; bruising: none; without midline tenderness Extremities: without edema; symmetrical without gross deformities; normal ROM of bilateral LE; mild knee pain reported with nl exam Skin: warm and dry Neurologic: normal gait; normal sensation and strength of bilateral LE Psychological: alert and cooperative; normal mood and affect   Allergies  Allergen Reactions   Shellfish-Derived Products Anaphylaxis    All seafood    Fish Allergy     History reviewed. No pertinent past medical history. Social History   Socioeconomic History   Marital status: Single    Spouse name: Not on file   Number of children: 0   Years of education: Not on file   Highest education level: Some college, no degree  Occupational History   Occupation: chicken farm  Tobacco Use   Smoking status: Never   Smokeless tobacco: Never  Vaping Use   Vaping Use: Never used  Substance and Sexual Activity   Alcohol use: Never   Drug use: Never   Sexual activity: Not on file  Other Topics Concern   Not on file  Social History Narrative   Not on file   Social Determinants of Health   Financial Resource Strain: Not on file  Food  Insecurity: No Food Insecurity   Worried About Programme researcher, broadcasting/film/video in the Last Year: Never true   Ran Out of Food in the Last Year: Never true  Transportation Needs: Not on file  Physical Activity: Not on file  Stress: Not on file  Social Connections: Not on file  Intimate Partner Violence: Not on file   Family History  Problem Relation Age of Onset   Healthy Mother    Hypertension Father    Eczema  Sister    Diabetes Other    Past Surgical History:  Procedure Laterality Date   NO PAST SURGERIES     WISDOM TOOTH EXTRACTION Bilateral       Mardella Layman, MD 05/22/21 1028    Mardella Layman, MD 05/22/21 1109

## 2021-06-15 ENCOUNTER — Encounter (HOSPITAL_COMMUNITY): Payer: Self-pay | Admitting: Emergency Medicine

## 2021-06-15 ENCOUNTER — Other Ambulatory Visit: Payer: Self-pay

## 2021-06-15 ENCOUNTER — Ambulatory Visit (HOSPITAL_COMMUNITY)
Admission: EM | Admit: 2021-06-15 | Discharge: 2021-06-15 | Disposition: A | Discharge status: Home / Self Care | Payer: Medicaid Other | Attending: Physician Assistant | Admitting: Physician Assistant

## 2021-06-15 DIAGNOSIS — M25462 Effusion, left knee: Secondary | ICD-10-CM

## 2021-06-15 DIAGNOSIS — M25562 Pain in left knee: Secondary | ICD-10-CM | POA: Diagnosis not present

## 2021-06-15 MED ORDER — NAPROXEN 500 MG PO TABS: 500.0000 mg | ORAL_TABLET | Freq: Two times a day (BID) | ORAL | 0 refills | Status: DC

## 2021-06-15 NOTE — Discharge Instructions (Signed)
I have called in Naprosyn to take twice a day to help with swelling and pain.  Do not take NSAIDs with this medication due to risk of GI bleeding which includes aspirin, ibuprofen/Advil, naproxen/Aleve.  You can use Tylenol for breakthrough pain.  Keep your knee elevated and continue using a brace.  Use ice for additional symptom relief.  Given recurrent symptoms I think it is reasonable to follow-up with an orthopedic provider.  Please call them to schedule an appointment.  If anything worsens please return for reevaluation.

## 2021-06-15 NOTE — ED Triage Notes (Signed)
Pt is present today with left knee pain that started four days ago. Pt states that she also has swelling in her left knee

## 2021-06-15 NOTE — ED Provider Notes (Signed)
[facility]    CSN: [id number] Arrival date & time: 06/15/21  1128      History   Chief Complaint Chief Complaint  Patient presents with   Knee Pain    Left     HPI Anna Davila is a 20 y.o. female.   Patient presents today with a 4-day history of worsening left knee pain.  She has had episodes of similar symptoms in the past and was seen by our clinic on 05/19/2021 which when she was given prednisone and diclofenac which provided temporary relief of symptoms.  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  She denies any popping, clicking, instability.  Denies previous injury or surgery on this knee.  Pain is rated 8 on a 0-10 pain scale, localized to anterior left knee, described as aching, worse with palpation or movement, no alleviating factors identified.  She is able to ambulate but has difficulty as result of pain.  She has not tried any over-the-counter medication for symptom management.  She has not seen a specialist in the past.   History reviewed. No pertinent past medical history.  Patient Active Problem List   Diagnosis Date Noted   Anaphylactic reaction due to other food products, subsequent encounter 04/22/2021   Other allergic rhinitis 04/22/2021   Allergic conjunctivitis of both eyes 04/22/2021   Obesity (BMI 30.0-34.9) 02/25/2021   Food allergy 02/25/2021   Other atopic dermatitis 02/25/2021   Influenza vaccine refused 08/20/2020   Chronic left shoulder pain 08/20/2020   Cervical strain 08/20/2020    Past Surgical History:  Procedure Laterality Date   NO PAST SURGERIES     WISDOM TOOTH EXTRACTION Bilateral     OB History   No obstetric history on file.      Home Medications    Prior to Admission medications   Medication Sig Start Date End Date Taking? Authorizing Provider  naproxen (NAPROSYN) 500 MG tablet Take  1 tablet (500 mg total) by mouth 2 (two) times daily. 06/15/21  Yes Theda Payer K, PA-C  Crisaborole (EUCRISA) 2 % OINT Apply 1 application topically 2 (two) times daily as needed (mild rash). 04/22/21   Ellamae Sia, DO  cromolyn (OPTICROM) 4 % ophthalmic solution Place 1 drop into both eyes 4 (four) times daily as needed (itchy/watery eyes). 04/22/21   Ellamae Sia, DO  EPINEPHrine 0.3 mg/0.3 mL IJ SOAJ injection Inject 0.3 mg into the muscle as needed for anaphylaxis. 02/25/21   Marcine Matar, MD  fluticasone (FLONASE) 50 MCG/ACT nasal spray Place 1 spray into both nostrils 2 (two) times daily as needed (nasal congestion). 04/22/21   Ellamae Sia, DO  levocetirizine (XYZAL) 5 MG tablet Take 1 tablet (5 mg total) by mouth every evening. 04/22/21   Ellamae Sia, DO  omeprazole (PRILOSEC) 20 MG capsule Take 1 capsule (20 mg total) by mouth daily. 12/26/19 05/29/20  Elvina Sidle, MD    Family History Family History  Problem Relation Age of Onset   Healthy Mother    Hypertension Father    Eczema Sister    Diabetes Other     Social History Social History   Tobacco Use   Smoking status: Never   Smokeless tobacco: Never  Vaping Use   Vaping Use: Never used  Substance Use Topics   Alcohol use: Never   Drug use:  Never     Allergies   Shellfish-derived products and Fish allergy   Review of Systems Review of Systems  Constitutional:  Positive for activity change. Negative for appetite change, fatigue and fever.  Respiratory:  Negative for cough and shortness of breath.   Cardiovascular:  Negative for chest pain and leg swelling.  Gastrointestinal:  Negative for abdominal pain, diarrhea, nausea and vomiting.  Musculoskeletal:  Positive for arthralgias and joint swelling. Negative for myalgias.  Neurological:  Negative for dizziness, weakness, light-headedness, numbness and headaches.    Physical Exam Triage Vital Signs ED Triage Vitals  Enc Vitals Group     BP 06/15/21 1303  124/85     Pulse Rate 06/15/21 1303 62     Resp 06/15/21 1303 18     Temp 06/15/21 1303 97.9 F (36.6 C)     Temp src --      SpO2 06/15/21 1303 99 %     Weight --      Height --      Head Circumference --      Peak Flow --      Pain Score 06/15/21 1301 8     Pain Loc --      Pain Edu? --      Excl. in GC? --    No data found.  Updated Vital Signs BP 124/85   Pulse 62   Temp 97.9 F (36.6 C)   Resp 18   SpO2 99%   Visual Acuity Right Eye Distance:   Left Eye Distance:   Bilateral Distance:    Right Eye Near:   Left Eye Near:    Bilateral Near:     Physical Exam Vitals reviewed.  Constitutional:      General: She is awake. She is not in acute distress.    Appearance: Normal appearance. She is well-developed. She is not ill-appearing.     Comments: Very pleasant female present age in no acute distress sitting comfortably in exam room  HENT:     Head: Normocephalic and atraumatic.  Cardiovascular:     Rate and Rhythm: Normal rate and regular rhythm.     Pulses:          Posterior tibial pulses are 2+ on the right side and 2+ on the left side.     Heart sounds: Normal heart sounds, S1 normal and S2 normal. No murmur heard. Pulmonary:     Effort: Pulmonary effort is normal.     Breath sounds: Normal breath sounds. No wheezing, rhonchi or rales.     Comments: Clear to auscultation bilaterally Abdominal:     Palpations: Abdomen is soft.     Tenderness: There is no abdominal tenderness.  Musculoskeletal:     Left knee: Swelling and effusion present. No bony tenderness. Decreased range of motion. Tenderness present over the medial joint line and lateral joint line. No LCL laxity, MCL laxity, ACL laxity or PCL laxity.    Right lower leg: No edema.     Left lower leg: No edema.     Comments: Left knee: Tender palpation at medial and lateral joint line.  No deformity noted.  Strength 5/5 bilateral lower extremities.  No ligamentous laxity on exam.  Moderate effusion  noted.  Psychiatric:        Behavior: Behavior is cooperative.     UC Treatments / Results  Labs (all labs ordered are listed, but only abnormal results are displayed) Labs Reviewed - No data to display  EKG  Radiology No results found.  Procedures Procedures (including critical care time)  Medications Ordered in UC Medications - No data to display  Initial Impression / Assessment and Plan / UC Course  I have reviewed the triage vital signs and the nursing notes.  Pertinent labs & imaging results that were available during my care of the patient were reviewed by me and considered in my medical decision making (see chart for details).      No indication for plain films given no bony tenderness on exam and patient denies any recent trauma.  Discussed concern for soft tissue injury such as meniscal injury given presentation.  She was started on Naprosyn for pain relief with instruction not to take additional NSAIDs with this medication due to risk of GI bleeding.  She can use Tylenol for breakthrough pain.  Recommended conservative treatment measures including elevation, rest, ice.  She does have a brace available and was encouraged to use this for support and comfort.  Discussed that given recurrent symptoms she would benefit from seeing orthopedic provider was given contact information for local practice and encouraged to call to schedule appointment.  Discussed that if she has any worsening symptoms she needs to return for reevaluation.  Strict return precautions given to which she expressed understanding.  Final Clinical Impressions(s) / UC Diagnoses   Final diagnoses:  Acute pain of left knee  Effusion of left knee     Discharge Instructions      I have called in Naprosyn to take twice a day to help with swelling and pain.  Do not take NSAIDs with this medication due to risk of GI bleeding which includes aspirin, ibuprofen/Advil, naproxen/Aleve.  You can use Tylenol for  breakthrough pain.  Keep your knee elevated and continue using a brace.  Use ice for additional symptom relief.  Given recurrent symptoms I think it is reasonable to follow-up with an orthopedic provider.  Please call them to schedule an appointment.  If anything worsens please return for reevaluation.     ED Prescriptions     Medication Sig Dispense Auth. Provider   naproxen (NAPROSYN) 500 MG tablet Take 1 tablet (500 mg total) by mouth 2 (two) times daily. 30 tablet Lanea Vankirk, Noberto Retort, PA-C      PDMP not reviewed this encounter.   Jeani Hawking, PA-C 06/15/21 1348

## 2021-08-04 ENCOUNTER — Ambulatory Visit: Payer: Medicaid Other | Admitting: Physician Assistant

## 2021-08-05 ENCOUNTER — Other Ambulatory Visit: Payer: Self-pay

## 2021-08-05 ENCOUNTER — Ambulatory Visit: Payer: Medicaid Other | Attending: Physician Assistant | Admitting: Internal Medicine

## 2021-08-05 VITALS — BP 127/80 | HR 78 | Ht 68.0 in | Wt 225.0 lb

## 2021-08-05 DIAGNOSIS — M25562 Pain in left knee: Secondary | ICD-10-CM | POA: Diagnosis not present

## 2021-08-05 DIAGNOSIS — G43109 Migraine with aura, not intractable, without status migrainosus: Secondary | ICD-10-CM | POA: Insufficient documentation

## 2021-08-05 DIAGNOSIS — M25561 Pain in right knee: Secondary | ICD-10-CM | POA: Diagnosis not present

## 2021-08-05 MED ORDER — IBUPROFEN 400 MG PO TABS: 800.0000 mg | ORAL_TABLET | Freq: Two times a day (BID) | ORAL | 2 refills | Status: DC | PRN

## 2021-08-05 MED ORDER — TOPIRAMATE 25 MG PO TABS: ORAL_TABLET | ORAL | 1 refills | Status: DC

## 2021-08-05 NOTE — Progress Notes (Signed)
Patient ID: Anna Davila, female    DOB: [birth year]  MRN: [id number]  CC: Migraine   Subjective: Anna Davila is a 20 y.o. female who presents for UC.  She has her infant brother with her. Her concerns today include:  Obesity, ezcema,food allergy  Started having HA less than 2 mths ago.  No prior hx of HA Occurs up to 3 x /wk and can last up to 4 hrs Can tell when HA is coming on. Starts in one area then spreads all over the head. No N/V/blurry vision or dizziness..  + photo/phenophobia Triggers include stress - trying to find another job and taking care of her infant brother Takes Ibuprofen occasionally two of 200 mg with partial relief of 40% Started using marijuana 2 x wk about 1 mth ago for this reason and reports good relief.    Having issues with knees in the past several weeks.  She has been having pain and stiffness.  She usually goes to the gym 4 times a week and does a lot of exercises involving her legs including lunges curls and squats.  She also does leg presses.  She has not been to the gym in the past 2 weeks because of the pain in the knees.  The right knee is most bothersome and she is noted some swelling. Patient Active Problem List   Diagnosis Date Noted   Anaphylactic reaction due to other food products, subsequent encounter 04/22/2021   Other allergic rhinitis 04/22/2021   Allergic conjunctivitis of both eyes 04/22/2021   Obesity (BMI 30.0-34.9) 02/25/2021   Food allergy 02/25/2021   Other atopic dermatitis 02/25/2021   Influenza vaccine refused 08/20/2020   Chronic left shoulder pain 08/20/2020   Cervical strain 08/20/2020     Current Outpatient Medications on File Prior to Visit  Medication Sig Dispense Refill   Crisaborole (EUCRISA) 2 % OINT Apply 1 application topically 2 (two) times daily as needed (mild rash). 60 g 5   cromolyn (OPTICROM) 4 % ophthalmic solution Place 1 drop into both eyes 4 (four) times daily as needed (itchy/watery eyes). 10 mL 3    EPINEPHrine 0.3 mg/0.3 mL IJ SOAJ injection Inject 0.3 mg into the muscle as needed for anaphylaxis. 1 each 1   fluticasone (FLONASE) 50 MCG/ACT nasal spray Place 1 spray into both nostrils 2 (two) times daily as needed (nasal congestion). 16 g 5   levocetirizine (XYZAL) 5 MG tablet Take 1 tablet (5 mg total) by mouth every evening. 30 tablet 5   naproxen (NAPROSYN) 500 MG tablet Take 1 tablet (500 mg total) by mouth 2 (two) times daily. 30 tablet 0   [DISCONTINUED] omeprazole (PRILOSEC) 20 MG capsule Take 1 capsule (20 mg total) by mouth daily. 7 capsule 3   No current facility-administered medications on file prior to visit.    Allergies  Allergen Reactions   Shellfish-Derived Products Anaphylaxis    All seafood    Fish Allergy     Social History   Socioeconomic History   Marital status: Single    Spouse name: Not on file   Number of children: 0   Years of education: Not on file   Highest education level: Some college, no degree  Occupational History   Occupation: chicken farm  Tobacco Use   Smoking status: Never   Smokeless tobacco: Never  Vaping Use   Vaping Use: Never used  Substance and Sexual Activity   Alcohol use: Never   Drug use: Never  Sexual activity: Not on file  Other Topics Concern   Not on file  Social History Narrative   Not on file   Social Determinants of Health   Financial Resource Strain: Not on file  Food Insecurity: No Food Insecurity   Worried About Running Out of Food in the Last Year: Never true   Ran Out of Food in the Last Year: Never true  Transportation Needs: Not on file  Physical Activity: Not on file  Stress: Not on file  Social Connections: Not on file  Intimate Partner Violence: Not on file    Family History  Problem Relation Age of Onset   Healthy Mother    Hypertension Father    Eczema Sister    Diabetes Other     Past Surgical History:  Procedure Laterality Date   NO PAST SURGERIES     WISDOM TOOTH EXTRACTION  Bilateral     ROS: Review of Systems Negative except as stated above  PHYSICAL EXAM: BP 127/80   Pulse 78   Ht 5\' 8"  (1.727 m)   Wt 225 lb (102.1 kg)   SpO2 99%   BMI 34.21 kg/m   Physical Exam Exam somewhat limited as she has her infant brother with her who she is holding in her arms because the child was crying in his rocker/car seat General appearance - alert, well appearing, young African-American female and in no distress Mental status - normal mood, behavior, speech, dress, motor activity, and thought processes Neurological - cranial nerves II through XII intact, motor and sensory grossly normal bilaterally Musculoskeletal -knees: She has mild edema of the right knee.  She has point tenderness along the medial joint line of both knees.  She has mild discomfort with passive range of motion of both knees.   ASSESSMENT AND PLAN: 1. Migraine with aura and without status migrainosus, not intractable -Discussed diagnosis and management. Try to work on reducing stress. We will use ibuprofen as abortive therapy since she reports 40% relief with the 400 mg tablet.  Try to avoid overuse.  We will use Topamax for prophylaxis to help decrease the frequency of the headaches.  Went over possible side effects including neuropathy symptoms in the extremities.  We will have her start on 25 mg at bedtime for the first 2 weeks and then after that increase it to 50 mg if headache frequency has not decreased to less than once a week on the 25 mg dose. Follow-up with me in 6 to 7 weeks - ibuprofen (ADVIL) 400 MG tablet; Take 2 tablets (800 mg total) by mouth 2 (two) times daily as needed for headache.  Dispense: 30 tablet; Refill: 2 - topiramate (TOPAMAX) 25 MG tablet; 1 tab PO QHS x 2 weeks.  If headache frequency does not decrease to less than once a week, increase to 50 mg QHS after 2 wks  Dispense: 60 tablet; Refill: 1  2. Acute pain of both knees Advised patient to avoid overuse of the knee  joints with exercise/weight training.  Try to mix it up such as she is not doing leg presses every time she goes to the gym.  Advised to stay out of the gym for now until her symptoms improve.  She can use the ibuprofen as needed. - Ambulatory referral to Orthopedic Surgery    Patient was given the opportunity to ask questions.  Patient verbalized understanding of the plan and was able to repeat key elements of the plan.   Orders Placed This  Encounter  Procedures   Ambulatory referral to Orthopedic Surgery     Requested Prescriptions   Signed Prescriptions Disp Refills   ibuprofen (ADVIL) 400 MG tablet 30 tablet 2    Sig: Take 2 tablets (800 mg total) by mouth 2 (two) times daily as needed for headache.   topiramate (TOPAMAX) 25 MG tablet 60 tablet 1    Sig: 1 tab PO QHS x 2 weeks.  If headache frequency does not decrease to less than once a week, increase to 50 mg QHS after 2 wks    Return in about 7 weeks (around 09/23/2021).  Jonah Blue, MD, FACP

## 2021-08-05 NOTE — Progress Notes (Signed)
Both knees having pain. Right knee swollen.

## 2021-08-05 NOTE — Patient Instructions (Signed)
Migraine Headache A migraine headache is a very strong throbbing pain on one side or both sides of your head. This type of headache can also cause other symptoms. It can last from 4 hours to 3 days. Talk with your doctor about what things may bring on (trigger) this condition. What are the causes? The exact cause of this condition is not known. This condition may be triggered or caused by: Drinking alcohol. Smoking. Taking medicines, such as: Medicine used to treat chest pain (nitroglycerin). Birth control pills. Estrogen. Some blood pressure medicines. Eating or drinking certain products. Doing physical activity. Other things that may trigger a migraine headache include: Having a menstrual period. Pregnancy. Hunger. Stress. Not getting enough sleep or getting too much sleep. Weather changes. Tiredness (fatigue). What increases the risk? Being 25-55 years old. Being female. Having a family history of migraine headaches. Being Caucasian. Having depression or anxiety. Being very overweight. What are the signs or symptoms? A throbbing pain. This pain may: Happen in any area of the head, such as on one side or both sides. Make it hard to do daily activities. Get worse with physical activity. Get worse around bright lights or loud noises. Other symptoms may include: Feeling sick to your stomach (nauseous). Vomiting. Dizziness. Being sensitive to bright lights, loud noises, or smells. Before you get a migraine headache, you may get warning signs (an aura). An aura may include: Seeing flashing lights or having blind spots. Seeing bright spots, halos, or zigzag lines. Having tunnel vision or blurred vision. Having numbness or a tingling feeling. Having trouble talking. Having weak muscles. Some people have symptoms after a migraine headache (postdromal phase), such as: Tiredness. Trouble thinking (concentrating). How is this treated? Taking medicines that: Relieve  pain. Relieve the feeling of being sick to your stomach. Prevent migraine headaches. Treatment may also include: Having acupuncture. Avoiding foods that bring on migraine headaches. Learning ways to control your body functions (biofeedback). Therapy to help you know and deal with negative thoughts (cognitive behavioral therapy). Follow these instructions at home: Medicines Take over-the-counter and prescription medicines only as told by your doctor. Ask your doctor if the medicine prescribed to you: Requires you to avoid driving or using heavy machinery. Can cause trouble pooping (constipation). You may need to take these steps to prevent or treat trouble pooping: Drink enough fluid to keep your pee (urine) pale yellow. Take over-the-counter or prescription medicines. Eat foods that are high in fiber. These include beans, whole grains, and fresh fruits and vegetables. Limit foods that are high in fat and sugar. These include fried or sweet foods. Lifestyle Do not drink alcohol. Do not use any products that contain nicotine or tobacco, such as cigarettes, e-cigarettes, and chewing tobacco. If you need help quitting, ask your doctor. Get at least 8 hours of sleep every night. Limit and deal with stress. General instructions   Keep a journal to find out what may bring on your migraine headaches. For example, write down: What you eat and drink. How much sleep you get. Any change in what you eat or drink. Any change in your medicines. If you have a migraine headache: Avoid things that make your symptoms worse, such as bright lights. It may help to lie down in a dark, quiet room. Do not drive or use heavy machinery. Ask your doctor what activities are safe for you. Keep all follow-up visits as told by your doctor. This is important. Contact a doctor if: You get a migraine headache that   is different or worse than others you have had. You have more than 15 headache days in one  month. Get help right away if: Your migraine headache gets very bad. Your migraine headache lasts longer than 72 hours. You have a fever. You have a stiff neck. You have trouble seeing. Your muscles feel weak or like you cannot control them. You start to lose your balance a lot. You start to have trouble walking. You pass out (faint). You have a seizure. Summary A migraine headache is a very strong throbbing pain on one side or both sides of your head. These headaches can also cause other symptoms. This condition may be treated with medicines and changes to your lifestyle. Keep a journal to find out what may bring on your migraine headaches. Contact a doctor if you get a migraine headache that is different or worse than others you have had. Contact your doctor if you have more than 15 headache days in a month. This information is not intended to replace advice given to you by your health care provider. Make sure you discuss any questions you have with your health care provider. Document Revised: 12/07/2018 Document Reviewed: 09/27/2018 Elsevier Patient Education  2022 Elsevier Inc.  

## 2021-08-10 ENCOUNTER — Ambulatory Visit (INDEPENDENT_AMBULATORY_CARE_PROVIDER_SITE_OTHER): Payer: Medicaid Other | Admitting: Orthopaedic Surgery

## 2021-08-10 ENCOUNTER — Other Ambulatory Visit: Payer: Self-pay

## 2021-08-10 ENCOUNTER — Ambulatory Visit: Payer: Self-pay

## 2021-08-10 ENCOUNTER — Ambulatory Visit (INDEPENDENT_AMBULATORY_CARE_PROVIDER_SITE_OTHER): Payer: Medicaid Other

## 2021-08-10 ENCOUNTER — Encounter: Payer: Self-pay | Admitting: Orthopaedic Surgery

## 2021-08-10 DIAGNOSIS — G8929 Other chronic pain: Secondary | ICD-10-CM

## 2021-08-10 DIAGNOSIS — M25561 Pain in right knee: Secondary | ICD-10-CM | POA: Diagnosis not present

## 2021-08-10 DIAGNOSIS — M25562 Pain in left knee: Secondary | ICD-10-CM | POA: Diagnosis not present

## 2021-08-10 MED ORDER — BUPIVACAINE HCL 0.5 % IJ SOLN
2.0000 mL | INTRAMUSCULAR | Status: AC | PRN
Start: 2021-08-10 — End: 2021-08-10
  Administered 2021-08-10: 2 mL via INTRA_ARTICULAR

## 2021-08-10 MED ORDER — CELECOXIB 200 MG PO CAPS: 200.0000 mg | ORAL_CAPSULE | Freq: Two times a day (BID) | ORAL | 3 refills | Status: DC

## 2021-08-10 MED ORDER — LIDOCAINE HCL 1 % IJ SOLN
2.0000 mL | INTRAMUSCULAR | Status: AC | PRN
  Administered 2021-08-10: 2 mL

## 2021-08-10 MED ORDER — METHYLPREDNISOLONE ACETATE 40 MG/ML IJ SUSP
40.0000 mg | INTRAMUSCULAR | Status: AC | PRN
  Administered 2021-08-10: 40 mg via INTRA_ARTICULAR

## 2021-08-10 NOTE — Progress Notes (Signed)
Office Visit Note   Patient: Anna Davila           Date of Birth: [birth year]           MRN: [id number] Visit Date: 08/10/2021              Requested by: Marcine Matar, MD [address] [place],  [place] [address] PCP: Marcine Matar, MD   Assessment & Plan: Visit Diagnoses:  1. Chronic pain of both knees     Plan: Impression is right knee pain with joint effusion.  I aspirated 30 cc of inflamed synovial fluid which was sent to the lab.  Cortisone administered as well.  Celebrex prescription sent in today.  I counseled the patient on importance of weight loss and muscular strengthening.  Referral for physical therapy.  She will also get knee compression sleeve.  Follow-up as needed.  Follow-Up Instructions: No follow-ups on file.   Orders:  Orders Placed This Encounter  Procedures   XR KNEE 3 VIEW RIGHT   XR KNEE 3 VIEW LEFT   Synovial Fluid Analysis, Complete   Ambulatory referral to Physical Therapy   Meds ordered this encounter  Medications   celecoxib (CELEBREX) 200 MG capsule    Sig: Take 1 capsule (200 mg total) by mouth 2 (two) times daily.    Dispense:  30 capsule    Refill:  3      Procedures: Large Joint Inj: R knee on 08/10/2021 11:36 AM Indications: pain Details: 22 G needle  Arthrogram: No  Medications: 40 mg methylPREDNISolone acetate 40 MG/ML; 2 mL lidocaine 1 %; 2 mL bupivacaine 0.5 % Consent was given by the patient. Patient was prepped and draped in the usual sterile fashion.      Clinical Data: No additional findings.   Subjective: Chief Complaint  Patient presents with   Right Knee - Pain   Left Knee - Pain    HPI  Patient is a healthy 20 year old female comes in for mainly right knee pain and swelling.  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  Denies any injuries.  Feels tight and swelling and limited flexion with the right knee.  Pain is worse at night.  Slight relief from ibuprofen.  Strong family history of  osteoarthritis.  Review of Systems   Objective: Vital Signs: There were no vitals taken for this visit.  Physical Exam  Ortho Exam  Right knee shows a moderate joint effusion with limited range of motion past 90 degrees.  Collaterals and cruciates are stable.  No joint line tenderness.  Specialty Comments:  No specialty comments available.  Imaging: XR KNEE 3 VIEW LEFT  Result Date: 08/10/2021 There is mild periarticular spurring of the medial compartment.  XR KNEE 3 VIEW RIGHT  Result Date: 08/10/2021 No acute or structural abnormalities.  Joint effusion    PMFS History: Patient Active Problem List   Diagnosis Date Noted   Migraine with aura and without status migrainosus, not intractable 08/05/2021   Anaphylactic reaction due to other food products, subsequent encounter 04/22/2021   Other allergic rhinitis 04/22/2021   Allergic conjunctivitis of both eyes 04/22/2021   Obesity (BMI 30.0-34.9) 02/25/2021   Food allergy 02/25/2021   Other atopic dermatitis 02/25/2021   Influenza vaccine refused 08/20/2020   Chronic left shoulder pain 08/20/2020   Cervical strain 08/20/2020   History reviewed. No pertinent past medical history.  Family History  Problem Relation Age of Onset   Healthy Mother  Hypertension Father    Eczema Sister    Diabetes Other     Past Surgical History:  Procedure Laterality Date   NO PAST SURGERIES     WISDOM TOOTH EXTRACTION Bilateral    Social History   Occupational History   Occupation: chicken farm  Tobacco Use   Smoking status: Never   Smokeless tobacco: Never  Vaping Use   Vaping Use: Never used  Substance and Sexual Activity   Alcohol use: Never   Drug use: Never   Sexual activity: Not on file

## 2021-08-11 LAB — SYNOVIAL FLUID ANALYSIS, COMPLETE
Basophils, %: 0 %
Eosinophils-Synovial: 0 % (ref 0–2)
Lymphocytes-Synovial Fld: 37 % (ref 0–74)
Monocyte/Macrophage: 55 % (ref 0–69)
Neutrophil, Synovial: 8 % (ref 0–24)
Synoviocytes, %: 0 % (ref 0–15)
WBC, Synovial: 1745 cells/uL — ABNORMAL HIGH (ref <150)

## 2021-11-20 ENCOUNTER — Ambulatory Visit (HOSPITAL_COMMUNITY)
Admission: EM | Admit: 2021-11-20 | Discharge: 2021-11-20 | Disposition: A | Discharge status: Home / Self Care | Payer: Medicaid Other | Attending: Family Medicine | Admitting: Family Medicine

## 2021-11-20 ENCOUNTER — Encounter (HOSPITAL_COMMUNITY): Payer: Self-pay | Admitting: *Deleted

## 2021-11-20 ENCOUNTER — Other Ambulatory Visit: Payer: Self-pay

## 2021-11-20 DIAGNOSIS — L309 Dermatitis, unspecified: Secondary | ICD-10-CM

## 2021-11-20 MED ORDER — FLUOCINONIDE 0.05 % EX OINT: 1.0000 "application " | TOPICAL_OINTMENT | Freq: Two times a day (BID) | CUTANEOUS | 1 refills | Status: DC

## 2021-11-20 MED ORDER — TRIAMCINOLONE ACETONIDE 0.1 % EX CREA: TOPICAL_CREAM | Freq: Two times a day (BID) | CUTANEOUS | 2 refills | Status: DC

## 2021-11-20 NOTE — ED Triage Notes (Signed)
Pt reports an allergy to sea food ,pollen and Pt has a flare up of eczema. ?

## 2021-11-21 ENCOUNTER — Other Ambulatory Visit: Payer: Self-pay

## 2021-11-21 ENCOUNTER — Ambulatory Visit (HOSPITAL_COMMUNITY): Payer: Self-pay

## 2021-11-21 DIAGNOSIS — R21 Rash and other nonspecific skin eruption: Secondary | ICD-10-CM | POA: Insufficient documentation

## 2021-11-21 NOTE — ED Triage Notes (Signed)
Pt via pov from home with rash that came up yesterday. Pt has hives on her arms, legs, torso. She doesn't have any new foods, detergents, etc in her environment. Pt was seen at [facility] yesterday but concerned that she didn't get to the cause. Pt alert & oriented, nad noted.  ?

## 2021-11-22 ENCOUNTER — Emergency Department (HOSPITAL_BASED_OUTPATIENT_CLINIC_OR_DEPARTMENT_OTHER)
Admission: EM | Admit: 2021-11-22 | Discharge: 2021-11-22 | Disposition: A | Discharge status: Home / Self Care | Payer: Medicaid Other | Attending: Emergency Medicine | Admitting: Emergency Medicine

## 2021-11-22 DIAGNOSIS — R21 Rash and other nonspecific skin eruption: Secondary | ICD-10-CM

## 2021-11-22 MED ORDER — PREDNISONE 50 MG PO TABS
60.0000 mg | ORAL_TABLET | Freq: Once | ORAL | Status: AC
  Administered 2021-11-22: 60 mg via ORAL
  Filled 2021-11-22: qty 1

## 2021-11-22 MED ORDER — PREDNISONE 10 MG (21) PO TBPK: ORAL_TABLET | ORAL | 0 refills | Status: DC

## 2021-11-22 NOTE — ED Provider Notes (Signed)
? ?[facility]  ?Provider Note ? ?CSN: [id number] ?Arrival date & time: 11/21/21 2105 ? ?History ?Chief Complaint  ?Patient presents with  ? Rash  ? ? ?Anna Davila is a 21 y.o. female with history of eczema reports Saturday morning she noticed itchy rash to arms and legs, different from her usual eczema. She had not had any known exposures but reports mother made seafood the other day and she was nearby but didn't eat it. She has not had any know insect bites. She went to [facility] and was given Rx for steroid cream and told it was eczema exacerbation.  ? ? ?Home Medications ?Prior to Admission medications   ?Medication Sig Start Date End Date Taking? Authorizing Provider  ?predniSONE (STERAPRED UNI-PAK 21 TAB) 10 MG (21) TBPK tablet 10mg  Tabs, 6 day taper. Use as directed 11/22/21  Yes 11/24/21, MD  ?celecoxib (CELEBREX) 200 MG capsule Take 1 capsule (200 mg total) by mouth 2 (two) times daily. 08/10/21   08/12/21, MD  ?Tarry Kos (EUCRISA) 2 % OINT Apply 1 application topically 2 (two) times daily as needed (mild rash). 04/22/21   04/24/21, DO  ?cromolyn (OPTICROM) 4 % ophthalmic solution Place 1 drop into both eyes 4 (four) times daily as needed (itchy/watery eyes). 04/22/21   04/24/21, DO  ?EPINEPHrine 0.3 mg/0.3 mL IJ SOAJ injection Inject 0.3 mg into the muscle as needed for anaphylaxis. 02/25/21   02/27/21, MD  ?fluocinonide ointment (LIDEX) 0.05 % Apply 1 application. topically 2 (two) times daily. 11/20/21   11/22/21, MD  ?fluticasone (FLONASE) 50 MCG/ACT nasal spray Place 1 spray into both nostrils 2 (two) times daily as needed (nasal congestion). 04/22/21   04/24/21, DO  ?ibuprofen (ADVIL) 400 MG tablet Take 2 tablets (800 mg total) by mouth 2 (two) times daily as needed for headache. 08/05/21   14/8/22, MD  ?levocetirizine (XYZAL) 5 MG tablet Take 1 tablet (5 mg total) by mouth every evening. 04/22/21   04/24/21, DO  ?naproxen (NAPROSYN)  500 MG tablet Take 1 tablet (500 mg total) by mouth 2 (two) times daily. 06/15/21   Raspet, 06/17/21, PA-C  ?topiramate (TOPAMAX) 25 MG tablet 1 tab PO QHS x 2 weeks.  If headache frequency does not decrease to less than once a week, increase to 50 mg QHS after 2 wks 08/05/21   14/8/22, MD  ?triamcinolone 0.1%-Cetaphil equivalent 3:1 cream mixture Apply topically 2 (two) times daily. 11/20/21   11/22/21, MD  ?omeprazole (PRILOSEC) 20 MG capsule Take 1 capsule (20 mg total) by mouth daily. 12/26/19 05/29/20  07/29/20, MD  ? ? ? ?Allergies    ?Shellfish-derived products and Fish allergy ? ? ?Review of Systems   ?Review of Systems ?Please see HPI for pertinent positives and negatives ? ?Physical Exam ?BP (!) 135/97 (BP Location: Left Arm)   Pulse 89   Temp 98.7 ?F (37.1 ?C)   Resp 16   Ht 5\' 8"  (1.727 m)   Wt 97.5 kg   LMP 11/07/2021 (Approximate)   SpO2 97%   BMI 32.69 kg/m?  ? ?Physical Exam ?Vitals and nursing note reviewed.  ?HENT:  ?   Head: Normocephalic.  ?   Nose: Nose normal.  ?Eyes:  ?   Extraocular Movements: Extraocular movements intact.  ?Pulmonary:  ?   Effort: Pulmonary effort is normal.  ?Musculoskeletal:     ?   General:  Normal range of motion.  ?   Cervical back: Neck supple.  ?Skin: ?   Findings: Rash (innumerable punctate, raised erythematous lesions to arms and legs, some have a small fluid filled vessicle) present.  ?Neurological:  ?   Mental Status: She is alert and oriented to person, place, and time.  ?Psychiatric:     ?   Mood and Affect: Mood normal.  ? ? ?ED Results / Procedures / Treatments   ?EKG ?None ? ?Procedures ?Procedures ? ?Medications Ordered in the ED ?Medications  ?predniSONE (DELTASONE) tablet 60 mg (has no administration in time range)  ? ? ?Initial Impression and Plan ? Patient with itchy rash of unknown etiology. Could be an allergic reaction, less likely insect bites. Does not have the typical appearance of chicken pox. Plan trial of oral steroids  and PCP follow up. Has seen derm many years ago, may need referral back if not improving.  ? ?ED Course  ? ?  ? ? ?MDM Rules/Calculators/A&P ?Medical Decision Making ?Problems Addressed: ?Rash: acute illness or injury ? ?Risk ?Prescription drug management. ? ? ? ?Final Clinical Impression(s) / ED Diagnoses ?Final diagnoses:  ?Rash  ? ? ?Rx / DC Orders ?ED Discharge Orders   ? ?      Ordered  ?  predniSONE (STERAPRED UNI-PAK 21 TAB) 10 MG (21) TBPK tablet       ? 11/22/21 0159  ? ?  ?  ? ?  ? ?  ?Pollyann Savoy, MD ?11/22/21 0159 ? ?

## 2021-11-22 NOTE — ED Provider Notes (Signed)
?  [facility] ? ? ?[id number] ?11/20/21 Arrival Time: 1545 ? ?ASSESSMENT & PLAN: ? ?1. Eczema, unspecified type   ? ?Discussed trying to avoid PO steroids. No signs of superficial bacterial infection. ?Begin: ?Meds ordered this encounter  ?Medications  ? fluocinonide ointment (LIDEX) 0.05 %  ?  Sig: Apply 1 application. topically 2 (two) times daily.  ?  Dispense:  60 g  ?  Refill:  1  ? triamcinolone 0.1%-Cetaphil equivalent 3:1 cream mixture  ?  Sig: Apply topically 2 (two) times daily.  ?  Dispense:  454 g  ?  Refill:  2  ? ?Will follow up with PCP or here if worsening or failing to improve as anticipated. ?Reviewed expectations re: course of current medical issues. Questions answered. ?Outlined signs and symptoms indicating need for more acute intervention. ?Patient verbalized understanding. ?After Visit Summary given. ? ? ?SUBJECTIVE: ? ?Lynnae Ludemann is a 21 y.o. female who presents with a skin complaint. Itchy rash/eczema exacerbation. Few weeks. Extremities mostly. No current tx. ? ? ?OBJECTIVE: ?Vitals:  ? 11/20/21 1740  ?BP: 121/80  ?Pulse: 80  ?Resp: 18  ?Temp: 98.2 ?F (36.8 ?C)  ?SpO2: 99%  ?  ?General appearance: alert; no distress ?HEENT: [place]; AT ?Neck: supple with FROM ?Extremities: no edema; moves all extremities normally ?Skin: warm and dry; diffuse eczematous changes over upper and lower extremities; no signs of infection ?Psychological: alert and cooperative; normal mood and affect ? ?Allergies  ?Allergen Reactions  ? Shellfish-Derived Products Anaphylaxis  ?  All seafood   ? Fish Allergy   ? ? ?History reviewed. No pertinent past medical history. ?Social History  ? ?Socioeconomic History  ? Marital status: Single  ?  Spouse name: Not on file  ? Number of children: 0  ? Years of education: Not on file  ? Highest education level: Some college, no degree  ?Occupational History  ? Occupation: chicken farm  ?Tobacco Use  ? Smoking status: Never  ? Smokeless tobacco: Never  ?Vaping Use  ? Vaping  Use: Never used  ?Substance and Sexual Activity  ? Alcohol use: Never  ? Drug use: Never  ? Sexual activity: Not on file  ?Other Topics Concern  ? Not on file  ?Social History Narrative  ? Not on file  ? ?Social Determinants of Health  ? ?Financial Resource Strain: Not on file  ?Food Insecurity: No Food Insecurity  ? Worried About Programme researcher, broadcasting/film/video in the Last Year: Never true  ? Ran Out of Food in the Last Year: Never true  ?Transportation Needs: Not on file  ?Physical Activity: Not on file  ?Stress: Not on file  ?Social Connections: Not on file  ?Intimate Partner Violence: Not on file  ? ?Family History  ?Problem Relation Age of Onset  ? Healthy Mother   ? Hypertension Father   ? Eczema Sister   ? Diabetes Other   ? ?Past Surgical History:  ?Procedure Laterality Date  ? NO PAST SURGERIES    ? WISDOM TOOTH EXTRACTION Bilateral   ? ? ?  ?Mardella Layman, MD ?11/22/21 [id number] ? ?

## 2021-11-25 ENCOUNTER — Ambulatory Visit: Payer: Self-pay

## 2021-11-25 NOTE — Telephone Encounter (Signed)
?  Chief Complaint: Eyes red, itchy ?Symptoms: Red sclera and eye lids, both eyes, painful and some swelling of lids. States vision is affected, severe itching ?Frequency: Saturday ?Pertinent Negatives: Patient denies cold symptoms,drainage, fever ?Disposition: [] ED /[] Urgent Care (no appt availability in office) / [] Appointment(In office/virtual)/ []  [place] [facility]/ [] Home Care/ [] Refused Recommended Disposition /[] [place] [facility]/ [x]  Follow-up with PCP ?Additional Notes: Pt went to [facility] Saturday and ED Sunday. States "All they focused on was my outbreak of eczema on my arms and legs." States dissatisfied with both visits, requesting appt at practice today or tomorrow. Advised no availability. Declined appt at [facility] with Cari. Previous encounter pt stated "Unacceptable." Assured pt NT would route to practice for PCPs review and final disposition. Expresses frustration. ?Reason for Disposition ? Eyelid is red and painful (or tender to touch) ? ?Answer Assessment - Initial Assessment Questions ?1. LOCATION: Location: "What's red, the eyeball or the outer eyelids?" (Note: when callers say the eye is red, they usually mean the sclera is red)   ?    Inside, sclera ?2. REDNESS OF SCLERA: "Is the redness in one or both eyes?" "When did the redness start?"  ?    Both eyes, onset sat ?3. ONSET: "When did the eye become red?" (e.g., hours, days)  ?    Saturday ?4. EYELIDS: "Are the eyelids red or swollen?" If Yes, ask: "How much?"  ?    Yes, swelling ?5. VISION: "Is there any difficulty seeing clearly?"  ?    yes ?6. ITCHING: "Does it feel itchy?" If so ask: "How bad is it" (e.g., Scale 1-10; or mild, moderate, severe) ?    Itchy ?7. PAIN: "Is there any pain? If Yes, ask: "How bad is it?" (e.g., Scale 1-10; or mild, moderate, severe) ?    10/10 at times. 8/10 today ?8. CONTACT LENS: "Do you wear contacts?" ?    no ?9. CAUSE: "What do you think is causing the redness?" ?    Unsure ?10. OTHER SYMPTOMS: "Do you  have any other symptoms?" (e.g., fever, runny nose, cough, vomiting) ?      no ? ?Protocols used: Eye - Red Without Pus-A-AH ? ?

## 2021-11-25 NOTE — Telephone Encounter (Signed)
Patient called, left VM to return the call to the office to discuss symptoms with a nurse. ? ?Summary: call back/ wants appt/ request specfic time to call back  ? Pt is complaining of red, itchy eyes that affect her vision, states she has been to ER and UC. She is not satisfied with results. 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   ?  ? ?

## 2021-11-30 ENCOUNTER — Ambulatory Visit: Payer: Medicaid Other | Attending: Internal Medicine | Admitting: Internal Medicine

## 2021-11-30 ENCOUNTER — Encounter: Payer: Self-pay | Admitting: Internal Medicine

## 2021-11-30 VITALS — BP 128/81 | HR 65 | Resp 16 | Wt 227.0 lb

## 2021-11-30 DIAGNOSIS — L309 Dermatitis, unspecified: Secondary | ICD-10-CM | POA: Diagnosis not present

## 2021-11-30 DIAGNOSIS — Z6834 Body mass index (BMI) 34.0-34.9, adult: Secondary | ICD-10-CM | POA: Insufficient documentation

## 2021-11-30 DIAGNOSIS — H53149 Visual discomfort, unspecified: Secondary | ICD-10-CM | POA: Diagnosis not present

## 2021-11-30 DIAGNOSIS — H1013 Acute atopic conjunctivitis, bilateral: Secondary | ICD-10-CM | POA: Diagnosis present

## 2021-11-30 DIAGNOSIS — E669 Obesity, unspecified: Secondary | ICD-10-CM | POA: Diagnosis not present

## 2021-11-30 DIAGNOSIS — H538 Other visual disturbances: Secondary | ICD-10-CM | POA: Diagnosis not present

## 2021-11-30 MED ORDER — OLOPATADINE HCL 0.1 % OP SOLN: 1.0000 [drp] | Freq: Two times a day (BID) | OPHTHALMIC | 0 refills | Status: DC

## 2021-11-30 NOTE — Progress Notes (Signed)
? ? ?Patient ID: Anna Davila, female    DOB: [birth year]  MRN: [id number] ? ?CC: Eye Problem (Pt states her eyes has been red for a while/Pt states she has tried otc eye drops with no relief in redness or itchiness /Pt states her eyes itches only at night/) and Medication Management (Pt was prescribed Triamcinolone 0.1%-Cetaphil cream and states it definitely helps with her eczema /Pt is requesting a ongoing prescription ) ? ? ?Subjective: ?Anna Davila is a 21 y.o. female who presents for UC visit ?Her concerns today include:  ?Obesity, ezcema,food allergy, migraine with aura ? ?Pt c/o redness and itching of both eyes x 2 wks. ?In a.m she wakes up with eyes matted shut due to crusting of the lashes. Some drainage in the mornings.  Does not wear contact lenses.  Some irritation with lashes getting in eyes. Some photophobia initially but not now.  Some blurred vision.  A few wks ago, she was having allergy symptoms of itchy ears and throat. She takes Benadryl ?Using OTC allergy drops but no relief ? ?Recently seen at urgent care for flare of eczema.  She was prescribed a combination mixture of triamcinolone cream with Cetaphil.  She finds this very helpful.  She has 2 refills on the rxn.  Wanting to know if she can get further Rfs from me ? ? ?Patient Active Problem List  ? Diagnosis Date Noted  ? Migraine with aura and without status migrainosus, not intractable 08/05/2021  ? Anaphylactic reaction due to other food products, subsequent encounter 04/22/2021  ? Other allergic rhinitis 04/22/2021  ? Allergic conjunctivitis of both eyes 04/22/2021  ? Obesity (BMI 30.0-34.9) 02/25/2021  ? Food allergy 02/25/2021  ? Other atopic dermatitis 02/25/2021  ? Influenza vaccine refused 08/20/2020  ? Chronic left shoulder pain 08/20/2020  ? Cervical strain 08/20/2020  ?  ? ?Current Outpatient Medications on File Prior to Visit  ?Medication Sig Dispense Refill  ? celecoxib (CELEBREX) 200 MG capsule Take 1 capsule (200 mg total) by  mouth 2 (two) times daily. 30 capsule 3  ? Crisaborole (EUCRISA) 2 % OINT Apply 1 application topically 2 (two) times daily as needed (mild rash). 60 g 5  ? cromolyn (OPTICROM) 4 % ophthalmic solution Place 1 drop into both eyes 4 (four) times daily as needed (itchy/watery eyes). 10 mL 3  ? EPINEPHrine 0.3 mg/0.3 mL IJ SOAJ injection Inject 0.3 mg into the muscle as needed for anaphylaxis. 1 each 1  ? fluocinonide ointment (LIDEX) 0.05 % Apply 1 application. topically 2 (two) times daily. 60 g 1  ? fluticasone (FLONASE) 50 MCG/ACT nasal spray Place 1 spray into both nostrils 2 (two) times daily as needed (nasal congestion). 16 g 5  ? ibuprofen (ADVIL) 400 MG tablet Take 2 tablets (800 mg total) by mouth 2 (two) times daily as needed for headache. 30 tablet 2  ? levocetirizine (XYZAL) 5 MG tablet Take 1 tablet (5 mg total) by mouth every evening. (Patient not taking: Reported on 11/30/2021) 30 tablet 5  ? naproxen (NAPROSYN) 500 MG tablet Take 1 tablet (500 mg total) by mouth 2 (two) times daily. (Patient not taking: Reported on 11/30/2021) 30 tablet 0  ? predniSONE (STERAPRED UNI-PAK 21 TAB) 10 MG (21) TBPK tablet 10mg  Tabs, 6 day taper. Use as directed (Patient not taking: Reported on 11/30/2021) 1 each 0  ? topiramate (TOPAMAX) 25 MG tablet 1 tab PO QHS x 2 weeks.  If headache frequency does not decrease to less than once  a week, increase to 50 mg QHS after 2 wks 60 tablet 1  ? triamcinolone 0.1%-Cetaphil equivalent 3:1 cream mixture Apply topically 2 (two) times daily. 454 g 2  ? [DISCONTINUED] omeprazole (PRILOSEC) 20 MG capsule Take 1 capsule (20 mg total) by mouth daily. 7 capsule 3  ? ?No current facility-administered medications on file prior to visit.  ? ? ?Allergies  ?Allergen Reactions  ? Shellfish-Derived Products Anaphylaxis  ?  All seafood   ? Fish Allergy   ? ? ?Social History  ? ?Socioeconomic History  ? Marital status: Single  ?  Spouse name: Not on file  ? Number of children: 0  ? Years of education: Not  on file  ? Highest education level: Some college, no degree  ?Occupational History  ? Occupation: chicken farm  ?Tobacco Use  ? Smoking status: Never  ? Smokeless tobacco: Never  ?Vaping Use  ? Vaping Use: Never used  ?Substance and Sexual Activity  ? Alcohol use: Never  ? Drug use: Never  ? Sexual activity: Not on file  ?Other Topics Concern  ? Not on file  ?Social History Narrative  ? Not on file  ? ?Social Determinants of Health  ? ?Financial Resource Strain: Not on file  ?Food Insecurity: No Food Insecurity  ? Worried About Programme researcher, broadcasting/film/video in the Last Year: Never true  ? Ran Out of Food in the Last Year: Never true  ?Transportation Needs: Not on file  ?Physical Activity: Not on file  ?Stress: Not on file  ?Social Connections: Not on file  ?Intimate Partner Violence: Not on file  ? ? ?Family History  ?Problem Relation Age of Onset  ? Healthy Mother   ? Hypertension Father   ? Eczema Sister   ? Diabetes Other   ? ? ?Past Surgical History:  ?Procedure Laterality Date  ? NO PAST SURGERIES    ? WISDOM TOOTH EXTRACTION Bilateral   ? ? ?ROS: ?Review of Systems ?Negative except as stated above ? ?PHYSICAL EXAM: ?BP 128/81   Pulse 65   Resp 16   Wt 227 lb (103 kg)   LMP 11/07/2021 (Approximate)   SpO2 98%   BMI 34.52 kg/m?   ?Physical Exam ? ?General appearance - alert, well appearing, and in no distress ?Mental status - normal mood, behavior, speech, dress, motor activity, and thought processes ?Eyes -moderate conjunctival injection bilaterally.  No drainage noted.  Pupils are equal and reactive.  Extraocular movement intact. ? ? ?   ? View : No data to display.  ?  ?  ?  ? ?ASSESSMENT AND PLAN: ?1. Allergic conjunctivitis of both eyes ?History and exam suggest allergic conjunctivitis.  I have sent prescription to her pharmacy for Patanol eyedrops.  Advised patient if no improvement within 2 to 3 days she should let me know. ? ?2. Eczema, unspecified type ?Advised that when she runs out of refills on her  current prescription for triamcinolone/Cetaphil cream, she can let me know and I will refill it for her.  Advised to use only when she has flare of eczema as prolonged use of steroid can cause thinning of the skin. ? ? ? ?Patient was given the opportunity to ask questions.  Patient verbalized understanding of the plan and was able to repeat key elements of the plan.  ? ?This documentation was completed using Paediatric nurse.  Any transcriptional errors are unintentional. ? ?No orders of the defined types were placed in this encounter. ? ? ? ?  Requested Prescriptions  ? ? No prescriptions requested or ordered in this encounter  ? ? ?No follow-ups on file. ? ?Jonah Blueeborah Kevina Piloto, MD, FACP ?

## 2021-11-30 NOTE — Patient Instructions (Addendum)
Use the allergy eye drop twice a day for the next 5 days then as needed.  Let me know if no improvement.  ?Recommend purchasing Claritin or Zyrtec over the counter and using daily during allergy season.  Zyrtec does cause drowsiness so best to take it at nights.  ?

## 2021-12-01 ENCOUNTER — Ambulatory Visit: Payer: Self-pay | Admitting: *Deleted

## 2021-12-01 NOTE — Telephone Encounter (Signed)
Summary: eye irritation redness and itching  ?  PCP prescribed olopatadine (PATANOL) 0.1 % ophthalmic solution for allergies and medication is causing eye irritation redness and itching   ?  ? ?Left voicemail for pt. ?

## 2021-12-01 NOTE — Telephone Encounter (Signed)
?  Chief Complaint: new eye drops hurt eyes ?Symptoms: red, runny, slightly swollen ?Frequency: since started med yesterday ?Pertinent Negatives: Patient denies change in vision ?Disposition: [] ED /[] Urgent Care (no appt availability in office) / [] Appointment(In office/virtual)/ []  [place] [facility]/ [] Home Care/ [] Refused Recommended Disposition /[]  Mobile Bus/ [x]  Follow-up with PCP ?Additional Notes: Pt started on drops after visit yesterday. Provider asked pt to call back if there were problems with the eye drops. Pt can not tolerate the drops. She has stopped them. ? ?Reason for Disposition ? [1] Mild eyelid irritation and eye redness are a recurrent problem AND [2] red and crusty eyelids ? ?Answer Assessment - Initial Assessment Questions ?1. LOCATION: Location: "What's red, the eyeball or the outer eyelids?" (Note: when callers say the eye is red, they usually mean the sclera is red)   ?    red ?2. REDNESS OF SCLERA: "Is the redness in one or both eyes?" "When did the redness start?"  ?    yes ?3. ONSET: "When did the eye become red?" (e.g., hours, days)  ?    Yesterday when started eye drops ?4. EYELIDS: "Are the eyelids red or swollen?" If Yes, ask: "How much?"  ?    Maybe a little ?5. VISION: "Is there any difficulty seeing clearly?"  ?    yes ?6. ITCHING: "Does it feel itchy?" If so ask: "How bad is it" (e.g., Scale 1-10; or mild, moderate, severe) ?    Yes, pretty bad ?7. PAIN: "Is there any pain? If Yes, ask: "How bad is it?" (e.g., Scale 1-10; or mild, moderate, severe) ?    A little ?8. CONTACT LENS: "Do you wear contacts?" ?    no ?9. CAUSE: "What do you think is causing the redness?" ?    New eye drops ?10. OTHER SYMPTOMS: "Do you have any other symptoms?" (e.g., fever, runny nose, cough, vomiting) ?      na ? ?Protocols used: Eye - Red Without Pus-A-AH ? ?

## 2021-12-01 NOTE — Telephone Encounter (Signed)
No answer, voicemail left. 

## 2021-12-02 ENCOUNTER — Telehealth: Payer: Medicaid Other | Admitting: Internal Medicine

## 2021-12-02 MED ORDER — KETOTIFEN FUMARATE 0.025 % OP SOLN: 1.0000 [drp] | Freq: Two times a day (BID) | OPHTHALMIC | 0 refills | Status: DC

## 2021-12-02 MED ORDER — POLYMYXIN B-TRIMETHOPRIM 10000-0.1 UNIT/ML-% OP SOLN: 1.0000 [drp] | Freq: Four times a day (QID) | OPHTHALMIC | 0 refills | Status: DC

## 2021-12-02 NOTE — Addendum Note (Signed)
Addended by: Jonah Blue B on: 12/02/2021 07:36 AM ? ? Modules accepted: Orders ? ?

## 2021-12-02 NOTE — Telephone Encounter (Signed)
Returned pt call and left vm making pt aware that I have placed eye resources up front for her to pick up and if she has any questions or concerns to give [place] a call  ?

## 2021-12-02 NOTE — Telephone Encounter (Signed)
Phone call returned to patient this morning.  Patient reports that the first day she use the Patanol eyedrops she did okay.  However the second day she experienced increased irritation of the eyes, itching and pain of the eyes.  Advised patient to stop the Patanol.  I will send a different allergy eyedrop and ophthalmic antibiotic.  Advised patient to call an ophthalmologist today and try to get in within the next several days.  She expressed understanding. ?

## 2021-12-02 NOTE — Telephone Encounter (Signed)
Copied from CRM [id number]. Topic: General - Call Back - No Documentation >> Dec 02, 2021 10:26 AM Randol Kern wrote: Reason for CRM: Pt called back to follow up on call she had this morning with PCP  She called about 7 different facilities for ophthalmology, she has left 2 voicemails and essentially was unable to schedule anywhere anytime soon. Wants to know what PCP wants her to do now, please advise    Best contact:  [phone]

## 2021-12-20 IMAGING — DX DG SHOULDER 2+V*L*
3 series · 3 of 3 positions shown · non-contrast
Comparison: None.

CLINICAL DATA: Pain following recent fall

EXAM:
LEFT SHOULDER - 2+ VIEW

[shoulder ap]
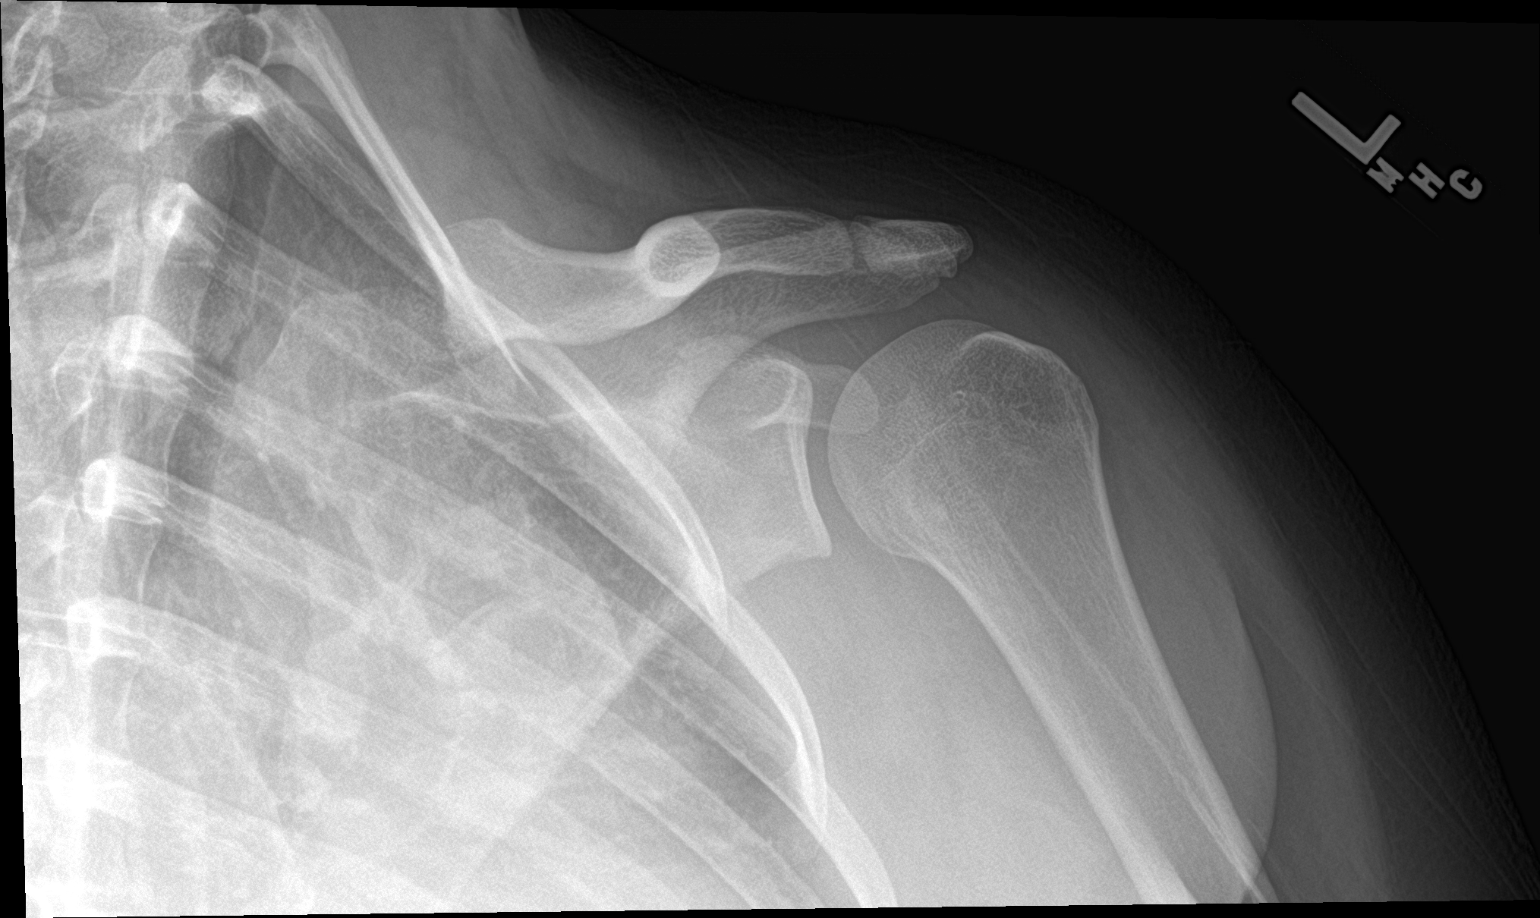

[shoulder y-view]
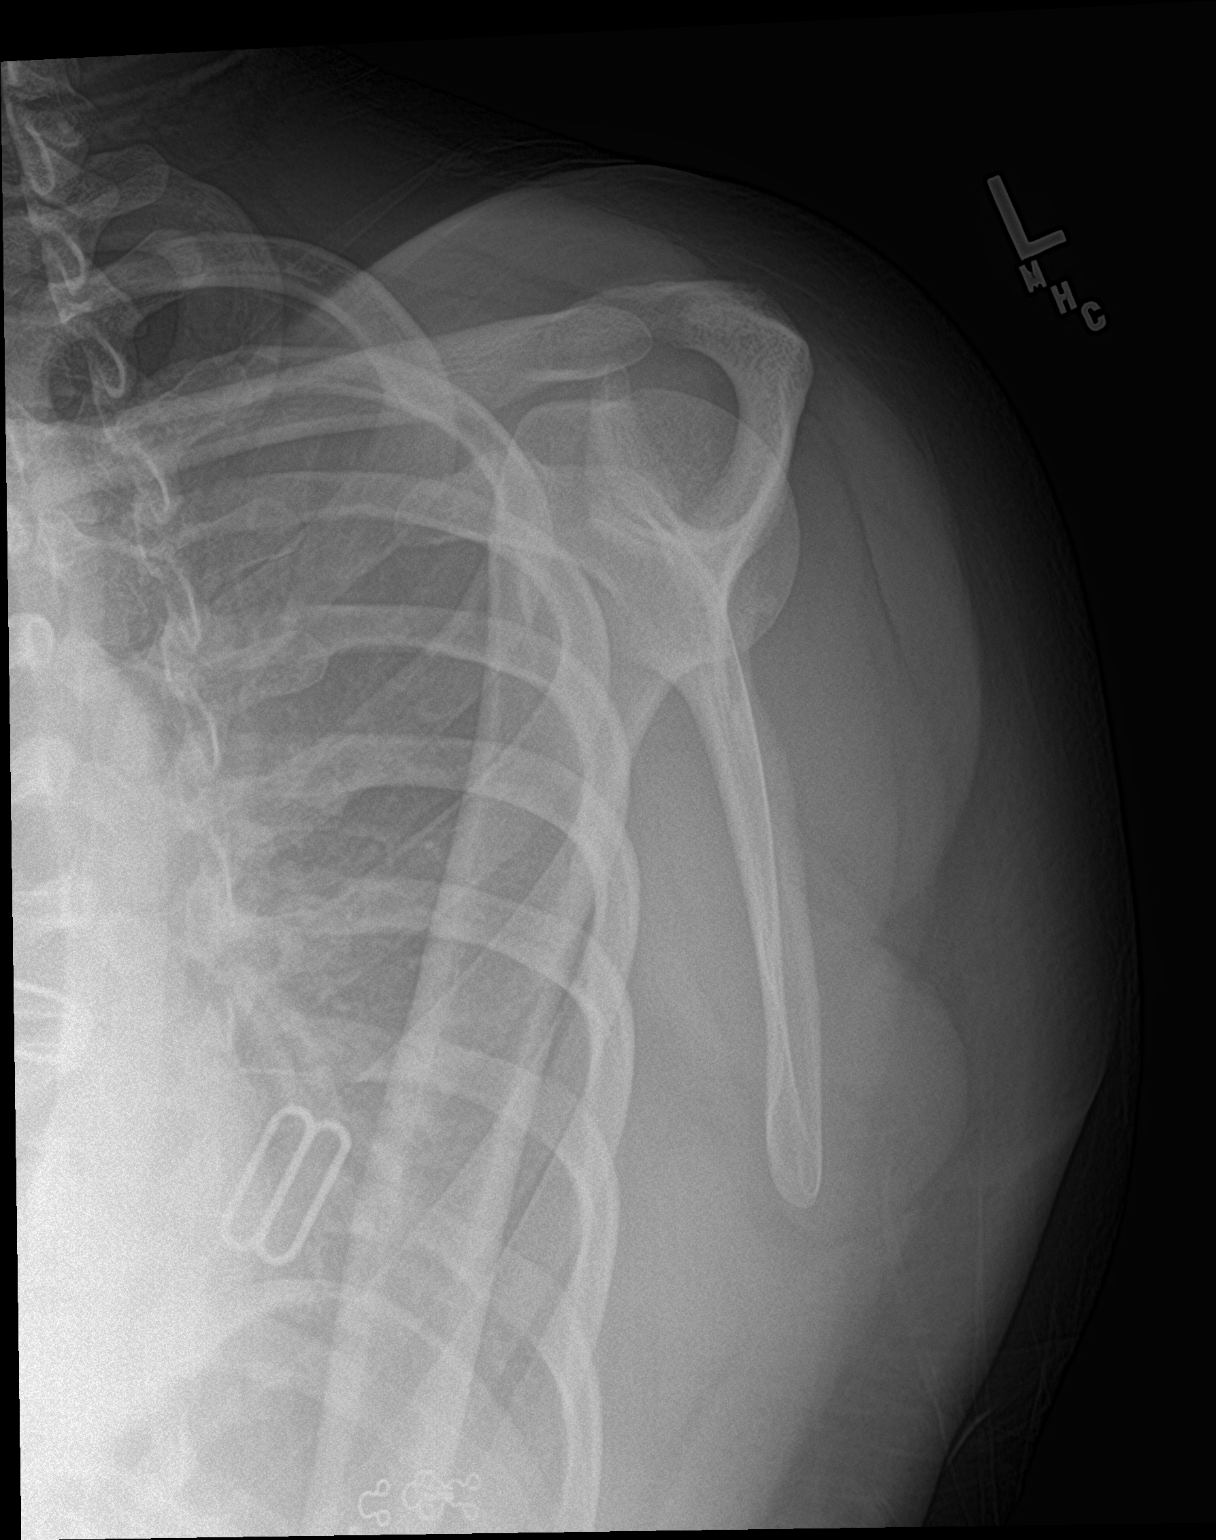

[shoulder axial]
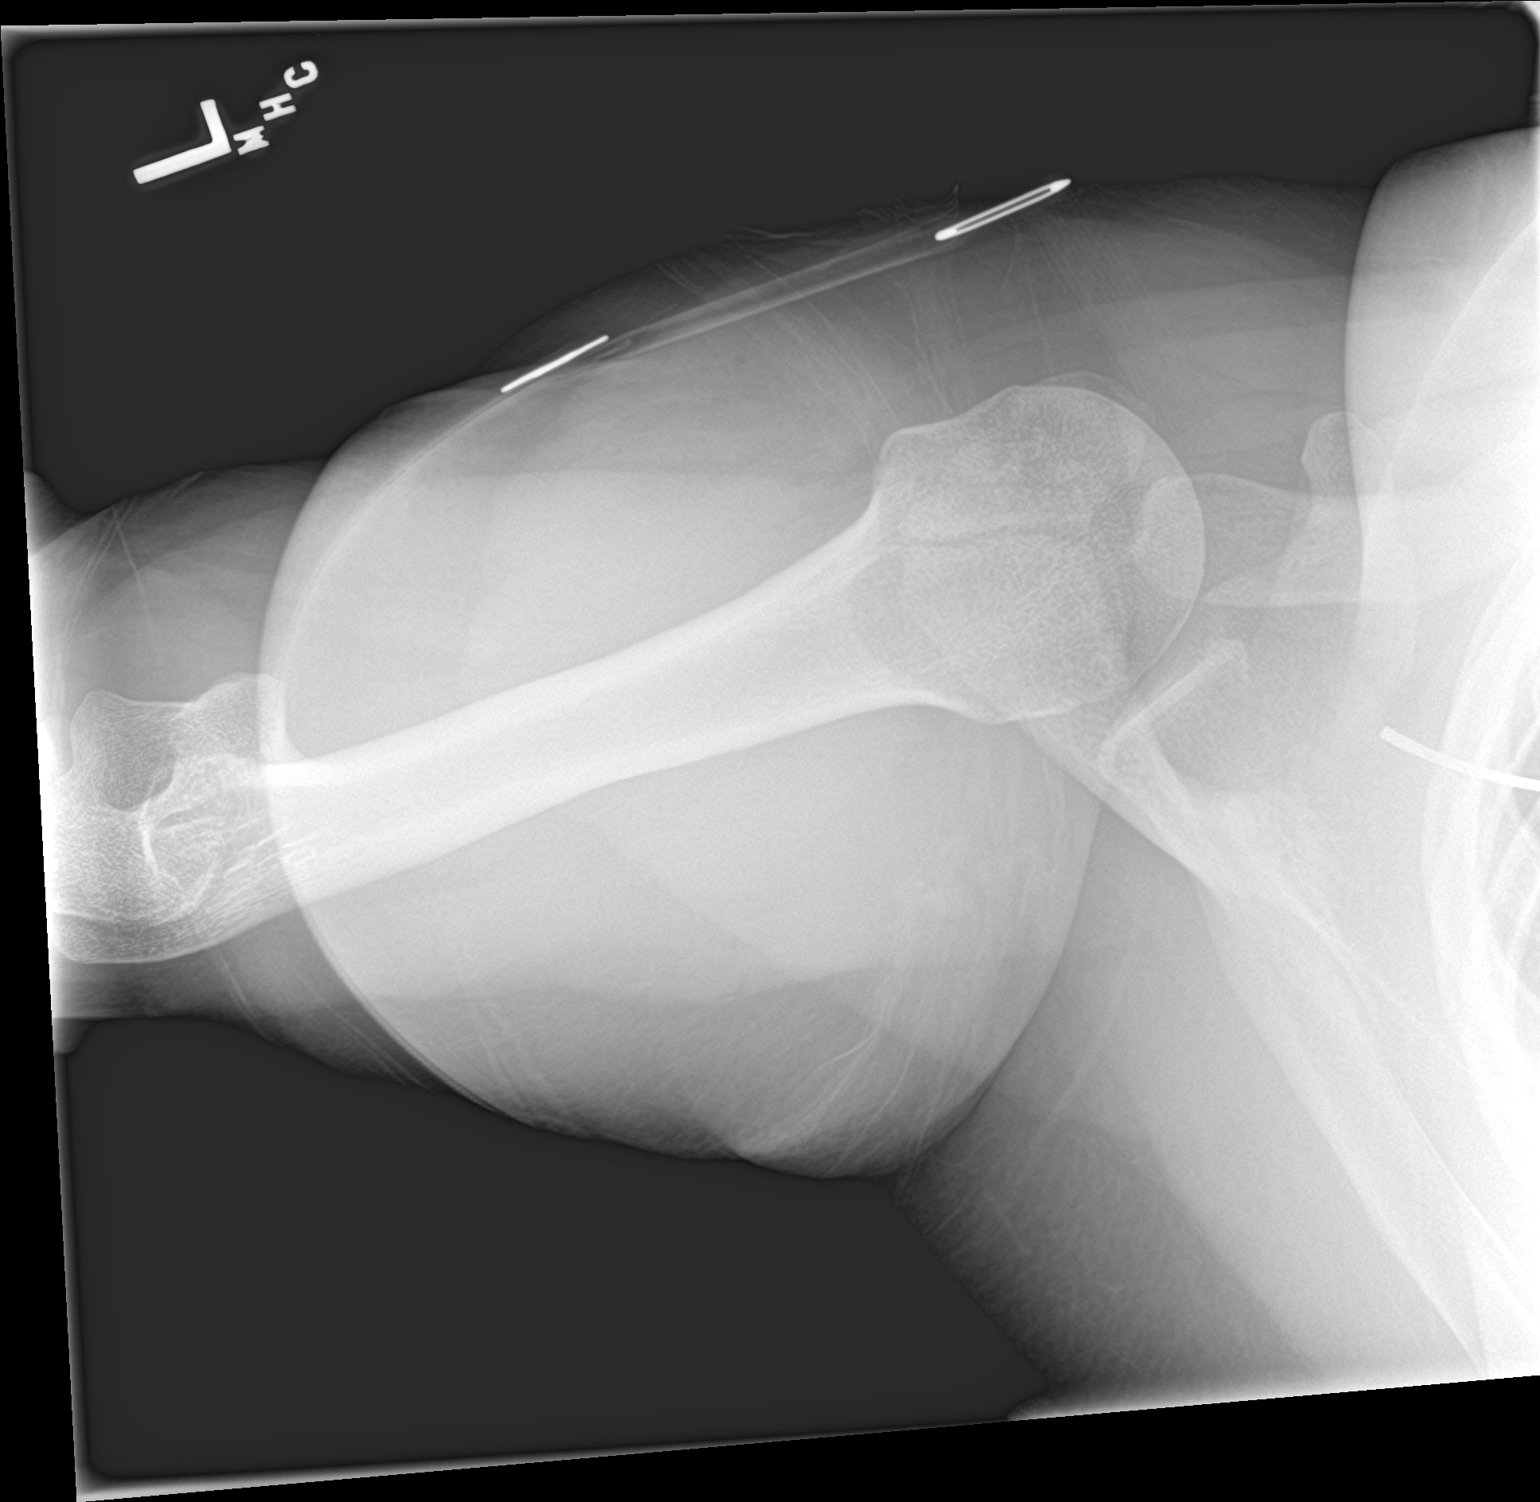

[3 of 3 positions shown; findings below may reference images not displayed]

FINDINGS: Oblique, Y scapular, and axillary images were obtained. No fracture
or dislocation. Joint spaces appear normal. No erosive change.
Visualized left lung clear.
IMPRESSION: No fracture or dislocation.  No evident arthropathy.

## 2022-02-25 ENCOUNTER — Encounter (HOSPITAL_COMMUNITY): Payer: Self-pay | Admitting: Emergency Medicine

## 2022-02-25 ENCOUNTER — Ambulatory Visit (HOSPITAL_COMMUNITY)
Admission: EM | Admit: 2022-02-25 | Discharge: 2022-02-25 | Disposition: A | Discharge status: Home / Self Care | Payer: Medicaid Other | Attending: Physician Assistant | Admitting: Physician Assistant

## 2022-02-25 ENCOUNTER — Encounter (HOSPITAL_BASED_OUTPATIENT_CLINIC_OR_DEPARTMENT_OTHER): Payer: Self-pay

## 2022-02-25 ENCOUNTER — Ambulatory Visit (INDEPENDENT_AMBULATORY_CARE_PROVIDER_SITE_OTHER): Payer: Medicaid Other

## 2022-02-25 ENCOUNTER — Emergency Department (HOSPITAL_BASED_OUTPATIENT_CLINIC_OR_DEPARTMENT_OTHER)
Admission: EM | Admit: 2022-02-25 | Discharge: 2022-02-25 | Disposition: A | Discharge status: Home / Self Care | Payer: Medicaid Other | Attending: Emergency Medicine | Admitting: Emergency Medicine

## 2022-02-25 ENCOUNTER — Emergency Department (HOSPITAL_BASED_OUTPATIENT_CLINIC_OR_DEPARTMENT_OTHER): Payer: Medicaid Other

## 2022-02-25 ENCOUNTER — Other Ambulatory Visit: Payer: Self-pay

## 2022-02-25 DIAGNOSIS — R109 Unspecified abdominal pain: Secondary | ICD-10-CM | POA: Diagnosis not present

## 2022-02-25 DIAGNOSIS — Z3202 Encounter for pregnancy test, result negative: Secondary | ICD-10-CM | POA: Diagnosis not present

## 2022-02-25 DIAGNOSIS — R1012 Left upper quadrant pain: Secondary | ICD-10-CM | POA: Diagnosis present

## 2022-02-25 DIAGNOSIS — R1032 Left lower quadrant pain: Secondary | ICD-10-CM | POA: Insufficient documentation

## 2022-02-25 LAB — CBC
HCT: 36 % (ref 36.0–46.0)
Hemoglobin: 11.4 g/dL — ABNORMAL LOW (ref 12.0–15.0)
MCH: 25.2 pg — ABNORMAL LOW (ref 26.0–34.0)
MCHC: 31.7 g/dL (ref 30.0–36.0)
MCV: 79.6 fL — ABNORMAL LOW (ref 80.0–100.0)
Platelets: 269 10*3/uL (ref 150–400)
RBC: 4.52 MIL/uL (ref 3.87–5.11)
RDW: 14.5 % (ref 11.5–15.5)
WBC: 7.8 10*3/uL (ref 4.0–10.5)
nRBC: 0 % (ref 0.0–0.2)

## 2022-02-25 LAB — URINALYSIS, ROUTINE W REFLEX MICROSCOPIC
Bilirubin Urine: NEGATIVE
Glucose, UA: NEGATIVE mg/dL
Hgb urine dipstick: NEGATIVE
Ketones, ur: NEGATIVE mg/dL
Leukocytes,Ua: NEGATIVE
Nitrite: NEGATIVE
Specific Gravity, Urine: 1.029 (ref 1.005–1.030)
pH: 6.5 (ref 5.0–8.0)

## 2022-02-25 LAB — COMPREHENSIVE METABOLIC PANEL
ALT: 22 U/L (ref 0–44)
AST: 31 U/L (ref 15–41)
Albumin: 4.5 g/dL (ref 3.5–5.0)
Alkaline Phosphatase: 73 U/L (ref 38–126)
Anion gap: 10 (ref 5–15)
BUN: 11 mg/dL (ref 6–20)
CO2: 25 mmol/L (ref 22–32)
Calcium: 9.3 mg/dL (ref 8.9–10.3)
Chloride: 103 mmol/L (ref 98–111)
Creatinine, Ser: 0.8 mg/dL (ref 0.44–1.00)
GFR, Estimated: >60 mL/min (ref >60)
Glucose, Bld: 87 mg/dL (ref 70–99)
Potassium: 3.7 mmol/L (ref 3.5–5.1)
Sodium: 138 mmol/L (ref 135–145)
Total Bilirubin: 0.3 mg/dL (ref 0.3–1.2)
Total Protein: 7.6 g/dL (ref 6.5–8.1)

## 2022-02-25 LAB — POCT URINALYSIS DIPSTICK, ED / UC
Bilirubin Urine: NEGATIVE
Glucose, UA: NEGATIVE mg/dL
Leukocytes,Ua: NEGATIVE
Nitrite: NEGATIVE
Protein, ur: 30 mg/dL — AB
Specific Gravity, Urine: 1.025 (ref 1.005–1.030)
Urobilinogen, UA: 0.2 mg/dL (ref 0.0–1.0)
pH: 5.5 (ref 5.0–8.0)

## 2022-02-25 LAB — PREGNANCY, URINE: Preg Test, Ur: NEGATIVE

## 2022-02-25 LAB — LIPASE, BLOOD: Lipase: 34 U/L (ref 11–51)

## 2022-02-25 LAB — POC URINE PREG, ED: Preg Test, Ur: NEGATIVE

## 2022-02-25 MED ORDER — KETOROLAC TROMETHAMINE 30 MG/ML IJ SOLN
30.0000 mg | Freq: Once | INTRAMUSCULAR | Status: AC
  Administered 2022-02-25: 30 mg via INTRAVENOUS
  Filled 2022-02-25: qty 1

## 2022-02-25 MED ORDER — IOHEXOL 300 MG/ML  SOLN
100.0000 mL | Freq: Once | INTRAMUSCULAR | Status: AC | PRN
  Administered 2022-02-25: 100 mL via INTRAVENOUS

## 2022-02-25 NOTE — Discharge Instructions (Signed)
Work-up today was reassuring as we discussed.  There is no acute process on your abdomen, follow-up with your primary next week for reevaluation.  You can take Tylenol Motrin for pain

## 2022-02-25 NOTE — ED Notes (Signed)
Patient given discharge instructions, all questions answered. Patient in possession of all belongings, directed to the discharge area  

## 2022-02-25 NOTE — ED Notes (Signed)
Labs obtained in the [facility] and taken to lab.

## 2022-02-25 NOTE — ED Triage Notes (Signed)
Pt presents with Left flank pain going into her Left lower back. Pain started at 0745 this am. Pt reports seen at UC, XR showed constipation. Pt sent here for CT scan

## 2022-02-25 NOTE — ED Provider Notes (Signed)
[facility] EMERGENCY DEPT Provider Note   CSN: [id number] Arrival date & time: 02/25/22  1645     History  Chief Complaint  Patient presents with   Abdominal Pain    Anna Davila is a 21 y.o. female.   Abdominal Pain   Patient with no contributable past medical history presents today with left-sided abdominal pain.  It started acutely this morning at 7:45 AM, its to the left side of her abdomen mostly in the upper part.  Constant, feels sharp.  Does not radiate elsewhere, worse with movement.  Denies any nausea, vomiting, fevers, change in bowel habits, rectal bleeding, dysuria, hematuria, vaginal bleeding, vaginal discharge, pelvic pain, new sexual partners.  Is never had any abdominal surgeries, no previous pregnancies or deliveries.  She had Motrin which improved the pain somewhat, still an 8 out of 10 to the left upper quadrant and sometimes to the left lower quadrant.  She was urgent care earlier today, sent to ED for further evaluation due to severity of pain.  Home Medications Prior to Admission medications   Medication Sig Start Date End Date Taking? Authorizing Provider  EPINEPHrine 0.3 mg/0.3 mL IJ SOAJ injection Inject 0.3 mg into the muscle as needed for anaphylaxis. 02/25/21   Marcine Matar, MD  ibuprofen (ADVIL) 400 MG tablet Take 2 tablets (800 mg total) by mouth 2 (two) times daily as needed for headache. 08/05/21   Marcine Matar, MD  omeprazole (PRILOSEC) 20 MG capsule Take 1 capsule (20 mg total) by mouth daily. 12/26/19 05/29/20  Elvina Sidle, MD      Allergies    Shellfish-derived products and Fish allergy    Review of Systems   Review of Systems  Gastrointestinal:  Positive for abdominal pain.    Physical Exam Updated Vital Signs BP (!) 141/69 (BP Location: Left Arm)   Pulse 66   Temp 98.4 F (36.9 C)   Resp 18   LMP 02/01/2022   SpO2 100%  Physical Exam Vitals and nursing note reviewed. Exam conducted with a chaperone  present.  Constitutional:      Appearance: Normal appearance.  HENT:     Head: Normocephalic and atraumatic.  Eyes:     General: No scleral icterus.       Right eye: No discharge.        Left eye: No discharge.     Extraocular Movements: Extraocular movements intact.     Pupils: Pupils are equal, round, and reactive to light.  Cardiovascular:     Rate and Rhythm: Normal rate and regular rhythm.     Pulses: Normal pulses.     Heart sounds: Normal heart sounds. No murmur heard.    No friction rub. No gallop.  Pulmonary:     Effort: Pulmonary effort is normal. No respiratory distress.     Breath sounds: Normal breath sounds.  Abdominal:     General: Abdomen is flat. Bowel sounds are normal. There is no distension.     Palpations: Abdomen is soft.     Tenderness: There is abdominal tenderness in the left upper quadrant and left lower quadrant. There is no right CVA tenderness or left CVA tenderness.  Skin:    General: Skin is warm and dry.     Coloration: Skin is not jaundiced.  Neurological:     Mental Status: She is alert. Mental status is at baseline.     Coordination: Coordination normal.     ED Results / Procedures / Treatments  Labs (all labs ordered are listed, but only abnormal results are displayed) Labs Reviewed  CBC - Abnormal; Notable for the following components:      Result Value   Hemoglobin 11.4 (*)    MCV 79.6 (*)    MCH 25.2 (*)    All other components within normal limits  URINALYSIS, ROUTINE W REFLEX MICROSCOPIC - Abnormal; Notable for the following components:   Protein, ur TRACE (*)    All other components within normal limits  LIPASE, BLOOD  COMPREHENSIVE METABOLIC PANEL  PREGNANCY, URINE    EKG None  Radiology CT Abdomen Pelvis W Contrast  Result Date: 02/25/2022 CLINICAL DATA:  LLQ abdominal pain. Left flank pain going into her Left lower back; Constipation EXAM: CT ABDOMEN AND PELVIS WITH CONTRAST TECHNIQUE: Multidetector CT imaging of  the abdomen and pelvis was performed using the standard protocol following bolus administration of intravenous contrast. RADIATION DOSE REDUCTION: This exam was performed according to the departmental dose-optimization program which includes automated exposure control, adjustment of the mA and/or kV according to patient size and/or use of iterative reconstruction technique. CONTRAST:  OMNIPAQUE IOHEXOL 300 MG/ML  SOLN COMPARISON:  None Available. FINDINGS: Lower chest: No acute abnormality. Hepatobiliary: No focal liver abnormality. No gallstones, gallbladder wall thickening, or pericholecystic fluid. No biliary dilatation. Pancreas: No focal lesion. Normal pancreatic contour. No surrounding inflammatory changes. No main pancreatic ductal dilatation. Spleen: Normal in size without focal abnormality. A splenule is noted. Adrenals/Urinary Tract: No adrenal nodule bilaterally. Bilateral kidneys enhance symmetrically. No hydronephrosis. No hydroureter. The urinary bladder is unremarkable. Stomach/Bowel: Stomach is within normal limits. No evidence of bowel wall thickening or dilatation. Appendix appears normal. Vascular/Lymphatic: No abdominal aorta or iliac aneurysm. No abdominal or pelvic lymphadenopathy. Right inguinal lymphadenopathy: 1.9 cm lymph node (2:78). Enlarged right [place] lymph node measuring 1.9 cm (2:76). Reproductive: Right corpus luteum cyst. Uterus and bilateral adnexa are unremarkable. Other: No intraperitoneal free fluid. No intraperitoneal free gas. No organized fluid collection. Musculoskeletal: No abdominal wall hernia or abnormality. No suspicious lytic or blastic osseous lesions. No acute displaced fracture. Multilevel degenerative changes of the spine. IMPRESSION: 1. Right inguinal lymphadenopathy of unclear etiology. 2. No acute intra-abdominal or intrapelvic abnormality. Electronically Signed   By: Tish Frederickson M.D.   On: 02/25/2022 21:05   DG Abdomen 1 View  Result Date:  02/25/2022 CLINICAL DATA:  Left abdominal pain. EXAM: ABDOMEN - 1 VIEW COMPARISON:  None Available. FINDINGS: Normal bowel gas pattern. Small to moderate amount of stool in the pelvis. No large abdominal or pelvic calcifications. Lung bases are clear. Prominent soft tissue in the left upper mediastinum. IMPRESSION: 1. Normal bowel gas pattern. Small to moderate amount of stool in the pelvis. 2. Prominent mediastinum that could be related to positioning and technique but indeterminate. Consider further evaluation with a chest radiograph to exclude abnormality in this area. Electronically Signed   By: Richarda Overlie M.D.   On: 02/25/2022 11:20    Procedures Procedures    Medications Ordered in ED Medications  ketorolac (TORADOL) 30 MG/ML injection 30 mg (30 mg Intravenous Given 02/25/22 2043)  iohexol (OMNIPAQUE) 300 MG/ML solution 100 mL (100 mLs Intravenous Contrast Given 02/25/22 2048)    ED Course/ Medical Decision Making/ A&P                           Medical Decision Making Amount and/or Complexity of Data Reviewed Labs: ordered. Radiology: ordered.  Risk Prescription drug  management.   Patient presents due to left-sided abdominal pain.  Differential is broad but includes UTI, pyelonephritis, nephrolithiasis, MSK, colitis, pancreatitis, gastritis, tendinopathy, ruptured ectopic pregnancy, ovarian torsion.  On exam she does have left upper quadrant abdominal tenderness without CVA tenderness.  There is no peritoneal signs or guarding, abdomen is soft.  She is well-appearing overall and speaking complete sentences, not tachycardic, hypoxic or in acute distress.  Laboratory work-up ordered, viewed interpreted by myself.  There is no leukocytosis, stable anemia with a hemoglobin of 11.4.  CMP is without any gross electrolyte derangement, AKI or transaminitis.  Lipase is negative, pregnancy test is negative so not an ectopic pregnancy.  UA is unremarkable.  I ordered patient Toradol for the  pain.  CT of abdomen pelvis ordered given she is having persistent 8 out of 10 pain all day and was sent from urgent care for further work-up and CT scan.  There was some lymphadenopathy to the right inguinal area noted but no acute process or left-sided process noted explain her symptomatology.  On reevaluation patient is resting comfortably and pain is well controlled.  She is mildly hypertensive but vitals are otherwise unremarkable.  Repeat abdominal exam is benign.  She is not having any vaginal or pelvic symptoms, no lower symptoms.  Considered pelvic exam and ultrasound but ultimately I do not have a high suspicion for torsion, oncologic etiology.  Discussed following up with primary regarding the lymphadenopathy, I do not think she needs any additional imaging or work-up here in the ED I encouraged her to follow-up with primary next week for reevaluation.  Return precaution discussed, discharged in stable condition.         Final Clinical Impression(s) / ED Diagnoses Final diagnoses:  Left upper quadrant abdominal pain    Rx / DC Orders ED Discharge Orders     None         Theron Arista, Cordelia Poche 02/25/22 2206    Milagros Loll, MD 02/26/22 2111

## 2022-02-25 NOTE — ED Triage Notes (Signed)
Around 745am today tried having a BM but not much came out. Then got pains on left lateral side of abd. Pain hurt to stand up and causing nausea. Denies urinary problems.

## 2022-02-25 NOTE — Discharge Instructions (Signed)
Please go to the emergency room for further evaluation given severity of your pain.

## 2022-02-25 NOTE — ED Notes (Signed)
Patient is being discharged from the Urgent Care and sent to the Emergency Department via POV . Per Dorann Ou PA, patient is in need of higher level of care due to severe abdominal pain. Patient is aware and verbalizes understanding of plan of care.  Vitals:   02/25/22 1035  BP: 118/74  Pulse: 65  Resp: 17  Temp: 98.2 F (36.8 C)  SpO2: 97%

## 2022-02-25 NOTE — ED Provider Notes (Signed)
[facility]    CSN: [id number] Arrival date & time: 02/25/22  0915      History   Chief Complaint Chief Complaint  Patient presents with   Abdominal Pain    HPI Anna Davila is a 21 y.o. female.   Patient presents today with several hour history of left-sided abdominal pain.  She reports that she was having some abdominal cramping and felt she needed to have a bowel movement.  She did pass a small amount of stool denies any blood or mucus in this.  Since that time she has had ongoing severe left-sided abdominal pain.  Pain is rated 10 on a 0-10 pain scale, described as intense cramping, no aggravating or alleviating factors identified.  She does report that she is generally been trying to eat a healthy diet but did eat a [employer] burger yesterday evening.  She denies any nausea, vomiting, fever, pelvic pain, vaginal discharge, urinary symptoms.  She does not believe that she is pregnant but is open to testing.  She is having difficulty daily 2 days as even standing is painful.  Denies history of gastrointestinal disorder including diverticulitis, Crohn's disease, ulcerative colitis.  She tried ibuprofen but this did not provide any relief of symptoms.  Denies previous abdominal surgery.    History reviewed. No pertinent past medical history.  Patient Active Problem List   Diagnosis Date Noted   Migraine with aura and without status migrainosus, not intractable 08/05/2021   Anaphylactic reaction due to other food products, subsequent encounter 04/22/2021   Other allergic rhinitis 04/22/2021   Allergic conjunctivitis of both eyes 04/22/2021   Obesity (BMI 30.0-34.9) 02/25/2021   Food allergy 02/25/2021   Other atopic dermatitis 02/25/2021   Influenza vaccine refused 08/20/2020   Chronic left shoulder pain 08/20/2020   Cervical strain 08/20/2020    Past Surgical History:  Procedure Laterality Date   NO PAST SURGERIES     WISDOM TOOTH EXTRACTION Bilateral     OB  History   No obstetric history on file.      Home Medications    Prior to Admission medications   Medication Sig Start Date End Date Taking? Authorizing Provider  EPINEPHrine 0.3 mg/0.3 mL IJ SOAJ injection Inject 0.3 mg into the muscle as needed for anaphylaxis. 02/25/21   Marcine Matar, MD  ibuprofen (ADVIL) 400 MG tablet Take 2 tablets (800 mg total) by mouth 2 (two) times daily as needed for headache. 08/05/21   Marcine Matar, MD  omeprazole (PRILOSEC) 20 MG capsule Take 1 capsule (20 mg total) by mouth daily. 12/26/19 05/29/20  Elvina Sidle, MD    Family History Family History  Problem Relation Age of Onset   Healthy Mother    Hypertension Father    Eczema Sister    Diabetes Other     Social History Social History   Tobacco Use   Smoking status: Never   Smokeless tobacco: Never  Vaping Use   Vaping Use: Never used  Substance Use Topics   Alcohol use: Never   Drug use: Never     Allergies   Shellfish-derived products and Fish allergy   Review of Systems Review of Systems  Constitutional:  Positive for activity change. Negative for appetite change, fatigue and fever.  Respiratory:  Negative for cough and shortness of breath.   Cardiovascular:  Negative for chest pain.  Gastrointestinal:  Positive for abdominal pain. Negative for diarrhea, nausea and vomiting.  Neurological:  Negative for dizziness, weakness, light-headedness, numbness  and headaches.     Physical Exam Triage Vital Signs ED Triage Vitals  Enc Vitals Group     BP 02/25/22 1035 118/74     Pulse Rate 02/25/22 1035 65     Resp 02/25/22 1035 17     Temp 02/25/22 1035 98.2 F (36.8 C)     Temp Source 02/25/22 1035 Oral     SpO2 02/25/22 1035 97 %     Weight --      Height --      Head Circumference --      Peak Flow --      Pain Score 02/25/22 1034 10     Pain Loc --      Pain Edu? --      Excl. in GC? --    No data found.  Updated Vital Signs BP 118/74 (BP Location:  Right Arm)   Pulse 65   Temp 98.2 F (36.8 C) (Oral)   Resp 17   LMP 02/01/2022   SpO2 97%   Visual Acuity Right Eye Distance:   Left Eye Distance:   Bilateral Distance:    Right Eye Near:   Left Eye Near:    Bilateral Near:     Physical Exam Vitals reviewed.  Constitutional:      General: She is awake. She is not in acute distress.    Appearance: Normal appearance. She is well-developed. She is ill-appearing.     Comments: Very pleasant female appears stated age leaned over in wheelchair in obvious discomfort but no acute distress  HENT:     Head: Normocephalic and atraumatic.     Mouth/Throat:     Pharynx: Uvula midline. No oropharyngeal exudate or posterior oropharyngeal erythema.  Cardiovascular:     Rate and Rhythm: Normal rate and regular rhythm.     Heart sounds: Normal heart sounds, S1 normal and S2 normal. No murmur heard. Pulmonary:     Effort: Pulmonary effort is normal.     Breath sounds: Normal breath sounds. No wheezing, rhonchi or rales.     Comments: Clear to auscultation bilaterally Abdominal:     General: Bowel sounds are normal.     Palpations: Abdomen is soft.     Tenderness: There is abdominal tenderness in the left upper quadrant and left lower quadrant. There is no right CVA tenderness, left CVA tenderness, guarding or rebound.     Comments: Tenderness palpation over left abdomen.  Psychiatric:        Behavior: Behavior is cooperative.      UC Treatments / Results  Labs (all labs ordered are listed, but only abnormal results are displayed) Labs Reviewed  POCT URINALYSIS DIPSTICK, ED / UC - Abnormal; Notable for the following components:      Result Value   Ketones, ur TRACE (*)    Hgb urine dipstick LARGE (*)    Protein, ur 30 (*)    All other components within normal limits  POC URINE PREG, ED    EKG   Radiology DG Abdomen 1 View  Result Date: 02/25/2022 CLINICAL DATA:  Left abdominal pain. EXAM: ABDOMEN - 1 VIEW COMPARISON:   None Available. FINDINGS: Normal bowel gas pattern. Small to moderate amount of stool in the pelvis. No large abdominal or pelvic calcifications. Lung bases are clear. Prominent soft tissue in the left upper mediastinum. IMPRESSION: 1. Normal bowel gas pattern. Small to moderate amount of stool in the pelvis. 2. Prominent mediastinum that could be related to positioning and technique  but indeterminate. Consider further evaluation with a chest radiograph to exclude abnormality in this area. Electronically Signed   By: Richarda Overlie M.D.   On: 02/25/2022 11:20    Procedures Procedures (including critical care time)  Medications Ordered in UC Medications - No data to display  Initial Impression / Assessment and Plan / UC Course  I have reviewed the triage vital signs and the nursing notes.  Pertinent labs & imaging results that were available during my care of the patient were reviewed by me and considered in my medical decision making (see chart for details).     Discussed that symptoms could be related to constipation but given severe and worsening abdominal pain she would likely benefit from CT scan which we do not have available in urgent care.  KUB was obtained that showed normal gas pattern with some stool in pelvis.  Urine pregnancy was negative.  UA showed no evidence of infection.  Given persistent severe pain patient is agreeable to emergency room evaluation and will go directly to [facility], ER following visit.  Her mother will transport her by private vehicle.  Her vital signs were stable at the time of discharge.  Final Clinical Impressions(s) / UC Diagnoses   Final diagnoses:  Left sided abdominal pain  Sudden onset of severe abdominal pain     Discharge Instructions      Please go to the emergency room for further evaluation given severity of your pain.     ED Prescriptions   None    PDMP not reviewed this encounter.   Jeani Hawking, PA-C 02/25/22 1134

## 2022-03-20 ENCOUNTER — Telehealth: Payer: Self-pay | Admitting: Internal Medicine

## 2022-03-22 NOTE — Telephone Encounter (Signed)
Requested medications are due for refill today.  no  Requested medications are on the active medications list.  no  Last refill. 11/20/2021   Future visit scheduled.   no  Notes to clinic.  Medication refill not delegated. Medication d/c'd 02/25/2022.  Requested Prescriptions  Pending Prescriptions Disp Refills   fluocinonide ointment (LIDEX) 0.05 % [Pharmacy Med Name: FLUOCINONIDE 0.05% OINT 60GM] 60 g 1    Sig: APPLY TOPICALLY TO THE AFFECTED AREA TWICE DAILY     Off-Protocol Failed - 03/20/2022  7:33 PM      Failed - Medication not assigned to a protocol, review manually.      Passed - Valid encounter within last 12 months    Recent Outpatient Visits           3 months ago Allergic conjunctivitis of both eyes   [facility] [place], Gavin Pound B, MD   7 months ago Migraine with aura and without status migrainosus, not intractable   [facility] Marcine Matar, MD   1 year ago Eczema, unspecified type   [facility] Marcine Matar, MD   1 year ago Chronic left shoulder pain   [place] [facility] Marcine Matar, MD

## 2022-03-29 NOTE — Telephone Encounter (Signed)
Per pharmacy, there is a hold on medication due to insurance. PA is required. I reached out and spoke with the pharmacy directly. The pharmacy mentioned they may be able to fill tomorrow.  Please advice.

## 2022-03-29 NOTE — Telephone Encounter (Signed)
Anna Davila,   Can we attempt a PA on this pt's Lidex?

## 2022-03-31 MED ORDER — BETAMETHASONE DIPROPIONATE 0.05 % EX CREA
TOPICAL_CREAM | Freq: Two times a day (BID) | CUTANEOUS | 2 refills | Status: DC
Start: 2022-03-31 — End: 2022-05-21

## 2022-03-31 NOTE — Telephone Encounter (Signed)
Called patient and she stated that she didn't really like the triamcinolone cream

## 2022-03-31 NOTE — Addendum Note (Signed)
Addended by: Jonah Blue B on: 03/31/2022 01:37 PM   Modules accepted: Orders

## 2022-04-01 NOTE — Telephone Encounter (Signed)
Called patient and she is aware of medication 

## 2022-04-04 ENCOUNTER — Other Ambulatory Visit: Payer: Self-pay

## 2022-05-21 ENCOUNTER — Ambulatory Visit (HOSPITAL_COMMUNITY)
Admission: EM | Admit: 2022-05-21 | Discharge: 2022-05-21 | Disposition: A | Discharge status: Home / Self Care | Payer: Self-pay | Attending: Physician Assistant | Admitting: Physician Assistant

## 2022-05-21 ENCOUNTER — Ambulatory Visit (INDEPENDENT_AMBULATORY_CARE_PROVIDER_SITE_OTHER): Payer: Self-pay

## 2022-05-21 ENCOUNTER — Encounter (HOSPITAL_COMMUNITY): Payer: Self-pay | Admitting: *Deleted

## 2022-05-21 DIAGNOSIS — M25561 Pain in right knee: Secondary | ICD-10-CM

## 2022-05-21 DIAGNOSIS — M25461 Effusion, right knee: Secondary | ICD-10-CM

## 2022-05-21 MED ORDER — IBUPROFEN 800 MG PO TABS: 800.0000 mg | ORAL_TABLET | Freq: Three times a day (TID) | ORAL | 0 refills | Status: DC

## 2022-05-21 NOTE — ED Triage Notes (Signed)
Denies injury. C/O right knee pain with some swelling onset approx 3 wks ago. 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 Ambulates with slight limp. Pt wishes to have XR.

## 2022-05-21 NOTE — Discharge Instructions (Signed)
Your x-ray showed swelling but was otherwise normal.  Keep your leg elevated and use the brace for comfort and support.  Use ibuprofen for pain.  Do not take NSAIDs with this medication including aspirin, ibuprofen/Advil, naproxen/Aleve.  You should follow-up with the orthopedic provider; call to schedule an appointment.  If anything worsens please return for reevaluation.

## 2022-05-21 NOTE — ED Provider Notes (Signed)
[place]    CSN: [id number] Arrival date & time: 05/21/22  1736      History   Chief Complaint Chief Complaint  Patient presents with   Knee Pain    HPI Anna Davila is a 21 y.o. female.   Patient presents today with 3-week history of recurrent and worsening right knee pain.  Reports associated swelling as well as occasional popping and clicking.  Denies any instability to the point that she has fallen.  She denies any known injury but does report that she works for [employer] and so has a strenuous job requiring her to get in and out of the [place] and go up and down stairs most of the day.  She reports that 6 months ago she was seen by orthopedic provider for similar symptoms at which point they drained fluid and gave her cortisone injection.  She did have a relief of pain at that time.  She has not tried any over-the-counter medication for symptom management.  She reports currently pain is rated 5 on a 0-10 pain scale but is worse with ambulation, no relieving factors identified.  Denies any numbness or paresthesias in the foot.  She is requesting x-ray.    History reviewed. No pertinent past medical history.  Patient Active Problem List   Diagnosis Date Noted   Migraine with aura and without status migrainosus, not intractable 08/05/2021   Anaphylactic reaction due to other food products, subsequent encounter 04/22/2021   Other allergic rhinitis 04/22/2021   Allergic conjunctivitis of both eyes 04/22/2021   Obesity (BMI 30.0-34.9) 02/25/2021   Food allergy 02/25/2021   Other atopic dermatitis 02/25/2021   Influenza vaccine refused 08/20/2020   Chronic left shoulder pain 08/20/2020   Cervical strain 08/20/2020    Past Surgical History:  Procedure Laterality Date   NO PAST SURGERIES     WISDOM TOOTH EXTRACTION Bilateral     OB History   No obstetric history on file.      Home Medications    Prior to Admission medications   Medication Sig Start Date End  Date Taking? Authorizing Provider  ibuprofen (ADVIL) 800 MG tablet Take 1 tablet (800 mg total) by mouth 3 (three) times daily. 05/21/22  Yes Vanessa Alesi K, PA-C  EPINEPHrine 0.3 mg/0.3 mL IJ SOAJ injection Inject 0.3 mg into the muscle as needed for anaphylaxis. 02/25/21   Ladell Pier, MD  omeprazole (PRILOSEC) 20 MG capsule Take 1 capsule (20 mg total) by mouth daily. 12/26/19 05/29/20  Robyn Haber, MD    Family History Family History  Problem Relation Age of Onset   Healthy Mother    Hypertension Father    Eczema Sister    Diabetes Other     Social History Social History   Tobacco Use   Smoking status: Never   Smokeless tobacco: Never  Vaping Use   Vaping Use: Never used  Substance Use Topics   Alcohol use: Not Currently   Drug use: Never     Allergies   Shellfish-derived products and Fish allergy   Review of Systems Review of Systems  Constitutional:  Positive for activity change. Negative for appetite change, fatigue and fever.  Musculoskeletal:  Positive for arthralgias, gait problem and joint swelling. Negative for myalgias.  Neurological:  Negative for dizziness, weakness, light-headedness, numbness and headaches.     Physical Exam Triage Vital Signs ED Triage Vitals  Enc Vitals Group     BP 05/21/22 1815 117/76     Pulse  Rate 05/21/22 1813 75     Resp 05/21/22 1813 16     Temp 05/21/22 1813 98.2 F (36.8 C)     Temp Source 05/21/22 1813 Oral     SpO2 05/21/22 1813 100 %     Weight --      Height --      Head Circumference --      Peak Flow --      Pain Score 05/21/22 1814 5     Pain Loc --      Pain Edu? --      Excl. in GC? --    No data found.  Updated Vital Signs BP 117/76   Pulse 75   Temp 98.2 F (36.8 C) (Oral)   Resp 16   LMP 05/07/2022 (Approximate)   SpO2 100%   Visual Acuity Right Eye Distance:   Left Eye Distance:   Bilateral Distance:    Right Eye Near:   Left Eye Near:    Bilateral Near:     Physical  Exam Vitals reviewed.  Constitutional:      General: She is awake. She is not in acute distress.    Appearance: Normal appearance. She is well-developed. She is not ill-appearing.     Comments: Very pleasant female presented age no acute distress sitting comfortably in exam room  HENT:     Head: Normocephalic and atraumatic.  Cardiovascular:     Rate and Rhythm: Normal rate and regular rhythm.     Pulses:          Posterior tibial pulses are 2+ on the right side.     Heart sounds: Normal heart sounds, S1 normal and S2 normal. No murmur heard. Pulmonary:     Effort: Pulmonary effort is normal.     Breath sounds: Normal breath sounds. No wheezing, rhonchi or rales.     Comments: Clear to auscultation bilaterally Musculoskeletal:     Left knee: Swelling and effusion present. No bony tenderness. Normal range of motion. No tenderness. No LCL laxity, MCL laxity, ACL laxity or PCL laxity.    Instability Tests: Medial McMurray test positive.  Psychiatric:        Behavior: Behavior is cooperative.      UC Treatments / Results  Labs (all labs ordered are listed, but only abnormal results are displayed) Labs Reviewed - No data to display  EKG   Radiology DG Knee Complete 4 Views Right  Result Date: 05/21/2022 CLINICAL DATA:  Right knee pain and swelling beginning 3 weeks ago. EXAM: RIGHT KNEE - COMPLETE 4+ VIEW COMPARISON:  Right knee radiographs 08/10/2021 FINDINGS: Moderate-sized joint effusion is present. No acute osseous abnormality is present. Joint spaces are preserved. IMPRESSION: Moderate-sized joint effusion without acute or focal osseous abnormality. Electronically Signed   By: Marin Roberts M.D.   On: 05/21/2022 18:42    Procedures Procedures (including critical care time)  Medications Ordered in UC Medications - No data to display  Initial Impression / Assessment and Plan / UC Course  I have reviewed the triage vital signs and the nursing notes.  Pertinent labs  & imaging results that were available during my care of the patient were reviewed by me and considered in my medical decision making (see chart for details).     X-ray was obtained per patient request which showed no acute osseous abnormality but did show effusion.  Concern for meniscal injury given clinical presentation and exam findings.  Recommended RICE protocol.  She was placed  in a brace for comfort and support.  She was prescribed ibuprofen 800 mg up to 3 times a day with instruction to take additional NSAIDs with this medication due to risk of GI bleeding.  Discussed that ultimately she will need to see an orthopedic provider and she was given contact information for local provider with injection call to schedule appointment.  She was provided work excuse note.  Discussed that if she has any worsening symptoms including difficulty bearing weight, increased swelling, increased pain, numbness or paresthesias in her foot she needs to be seen immediately.  Strict return precautions given.  Final Clinical Impressions(s) / UC Diagnoses   Final diagnoses:  Effusion of right knee  Acute pain of right knee     Discharge Instructions      Your x-ray showed swelling but was otherwise normal.  Keep your leg elevated and use the brace for comfort and support.  Use ibuprofen for pain.  Do not take NSAIDs with this medication including aspirin, ibuprofen/Advil, naproxen/Aleve.  You should follow-up with the orthopedic provider; call to schedule an appointment.  If anything worsens please return for reevaluation.     ED Prescriptions     Medication Sig Dispense Auth. Provider   ibuprofen (ADVIL) 800 MG tablet Take 1 tablet (800 mg total) by mouth 3 (three) times daily. 21 tablet Emylia Latella, Noberto Retort, PA-C      PDMP not reviewed this encounter.   Jeani Hawking, PA-C 05/21/22 1906

## 2022-06-23 ENCOUNTER — Ambulatory Visit: Payer: Medicaid Other | Attending: Internal Medicine | Admitting: Internal Medicine

## 2022-06-23 ENCOUNTER — Encounter: Payer: Self-pay | Admitting: Internal Medicine

## 2022-06-23 VITALS — BP 122/81 | HR 68 | Temp 99.3°F | Resp 16 | Ht 68.0 in | Wt 220.0 lb

## 2022-06-23 DIAGNOSIS — E669 Obesity, unspecified: Secondary | ICD-10-CM | POA: Diagnosis present

## 2022-06-23 DIAGNOSIS — Z6833 Body mass index (BMI) 33.0-33.9, adult: Secondary | ICD-10-CM | POA: Diagnosis present

## 2022-06-23 DIAGNOSIS — Z79899 Other long term (current) drug therapy: Secondary | ICD-10-CM | POA: Diagnosis not present

## 2022-06-23 DIAGNOSIS — M25562 Pain in left knee: Secondary | ICD-10-CM | POA: Insufficient documentation

## 2022-06-23 DIAGNOSIS — K529 Noninfective gastroenteritis and colitis, unspecified: Secondary | ICD-10-CM | POA: Insufficient documentation

## 2022-06-23 DIAGNOSIS — M25561 Pain in right knee: Secondary | ICD-10-CM | POA: Insufficient documentation

## 2022-06-23 DIAGNOSIS — G8929 Other chronic pain: Secondary | ICD-10-CM | POA: Diagnosis not present

## 2022-06-23 DIAGNOSIS — G43109 Migraine with aura, not intractable, without status migrainosus: Secondary | ICD-10-CM | POA: Insufficient documentation

## 2022-06-23 MED ORDER — IBUPROFEN 800 MG PO TABS: 800.0000 mg | ORAL_TABLET | Freq: Three times a day (TID) | ORAL | 0 refills | Status: AC | PRN

## 2022-06-23 MED ORDER — SUMATRIPTAN SUCCINATE 50 MG PO TABS: ORAL_TABLET | ORAL | 1 refills | Status: AC

## 2022-06-23 MED ORDER — TOPIRAMATE 25 MG PO TABS: ORAL_TABLET | ORAL | 1 refills | Status: AC

## 2022-06-23 NOTE — Progress Notes (Signed)
Bilateral knee pain  Swollen lymph nodes Headaches  Blood type

## 2022-06-23 NOTE — Progress Notes (Signed)
Patient ID: Anna Davila, female    DOB: June 24, 2001  MRN: [id number]  CC: No chief complaint on file.   Subjective: Anna Davila is a 21 y.o. female who presents for chronic ds management Her concerns today include:  Obesity, ezcema,food allergy, migraine with aura  C/o flare of migraines within the last 2 wks -strong 2 days ago.  Took 2 BC which helped Had migraine 5-6x in last 2 wks Located LT side.  Can last few to several hrs Assoc with photophobia, light dizziness.  No N/V No identifiable triggers - wonders if due to being on lap top doing homework and lack of sleep.   Takes [facility] which helps Prescribed Topamax 07/2021 to help dec freq.  She would stop taking it because she does not like taking medications.  She thinks she still may have some at home in her drawer.  C/o pain in both knees RT>LT x 3 mths.  Hurts to bend or squat. Use to be active going to gym earlier this yr but had to stop. 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  Both knees hit ground.  Made issue with knees worse.  Endorses swelling and popping.  Popping when standing and walking Seen in UC 1 mth ago for RT knee pain.  Given knee brace which helps.  Wears only when working  Requesting note for being out of work on the 24th of this month.  Had acute gastrointestinal issues that have since resolved. Patient Active Problem List   Diagnosis Date Noted   Migraine with aura and without status migrainosus, not intractable 08/05/2021   Anaphylactic reaction due to other food products, subsequent encounter 04/22/2021   Other allergic rhinitis 04/22/2021   Allergic conjunctivitis of both eyes 04/22/2021   Obesity (BMI 30.0-34.9) 02/25/2021   Food allergy 02/25/2021   Other atopic dermatitis 02/25/2021   Influenza vaccine refused 08/20/2020   Chronic left shoulder pain 08/20/2020   Cervical strain 08/20/2020     Current Outpatient Medications on File Prior to Visit  Medication Sig Dispense Refill    EPINEPHrine 0.3 mg/0.3 mL IJ SOAJ injection Inject 0.3 mg into the muscle as needed for anaphylaxis. 1 each 1   triamcinolone ointment (KENALOG) 0.1 % Apply 1 Application topically 2 (two) times daily.     ibuprofen (ADVIL) 800 MG tablet Take 1 tablet (800 mg total) by mouth 3 (three) times daily. (Patient not taking: Reported on 06/23/2022) 21 tablet 0   [DISCONTINUED] omeprazole (PRILOSEC) 20 MG capsule Take 1 capsule (20 mg total) by mouth daily. 7 capsule 3   No current facility-administered medications on file prior to visit.    Allergies  Allergen Reactions   Shellfish-Derived Products Anaphylaxis    All seafood    Fish Allergy     Social History   Socioeconomic History   Marital status: Single    Spouse name: Not on file   Number of children: 0   Years of education: Not on file   Highest education level: Some college, no degree  Occupational History   Occupation: chicken farm  Tobacco Use   Smoking status: Never   Smokeless tobacco: Never  Vaping Use   Vaping Use: Never used  Substance and Sexual Activity   Alcohol use: Not Currently   Drug use: Never   Sexual activity: Yes    Birth control/protection: None  Other Topics Concern   Not on file  Social History Narrative   Not on file   Social Determinants  of Health   Financial Resource Strain: Not on file  Food Insecurity: No Food Insecurity (04/07/2021)   Hunger Vital Sign    Worried About Running Out of Food in the Last Year: Never true    Ran Out of Food in the Last Year: Never true  Transportation Needs: Not on file  Physical Activity: Not on file  Stress: Not on file  Social Connections: Not on file  Intimate Partner Violence: Not on file    Family History  Problem Relation Age of Onset   Healthy Mother    Hypertension Father    Eczema Sister    Diabetes Other     Past Surgical History:  Procedure Laterality Date   NO PAST SURGERIES     WISDOM TOOTH EXTRACTION Bilateral     ROS: Review of  Systems Negative except as stated above  PHYSICAL EXAM: BP 122/81 (BP Location: Left Arm, Patient Position: Sitting, Cuff Size: Large)   Pulse 68   Temp 99.3 F (37.4 C)   Resp 16   Ht 5\' 8"  (1.727 m)   Wt 220 lb (99.8 kg)   LMP 06/16/2022 (Approximate)   SpO2 99%   BMI 33.45 kg/m   Physical Exam  General appearance - alert, well appearing, and in no distress Mental status - normal mood, behavior, speech, dress, motor activity, and thought processes Neurological - cranial nerves II through XII intact, motor and sensory grossly normal bilaterally.  Gait is stable and unassisted. Musculoskeletal -knees: Soft tissue of right knee mildly enlarged suggesting effusion.  No point tenderness of either knee.Marland Kitchen  No crepitus on passive range of motion of either knee. Extremities - peripheral pulses normal, no pedal edema, no clubbing or cyanosis      Latest Ref Rng & Units 02/25/2022    5:28 PM  CMP  Glucose 70 - 99 mg/dL 87   BUN 6 - 20 mg/dL 11   Creatinine 0.44 - 1.00 mg/dL 0.80   Sodium 135 - 145 mmol/L 138   Potassium 3.5 - 5.1 mmol/L 3.7   Chloride 98 - 111 mmol/L 103   CO2 22 - 32 mmol/L 25   Calcium 8.9 - 10.3 mg/dL 9.3   Total Protein 6.5 - 8.1 g/dL 7.6   Total Bilirubin 0.3 - 1.2 mg/dL 0.3   Alkaline Phos 38 - 126 U/L 73   AST 15 - 41 U/L 31   ALT 0 - 44 U/L 22    CBC    Component Value Date/Time   WBC 7.8 02/25/2022 1728   RBC 4.52 02/25/2022 1728   HGB 11.4 (L) 02/25/2022 1728   HCT 36.0 02/25/2022 1728   PLT 269 02/25/2022 1728   MCV 79.6 (L) 02/25/2022 1728   MCH 25.2 (L) 02/25/2022 1728   MCHC 31.7 02/25/2022 1728   RDW 14.5 02/25/2022 1728    ASSESSMENT AND PLAN: 1. Migraine with aura and without status migrainosus, not intractable Discussed the importance of getting in 7 to 8 hours of sleep each night.  Try to avoid other triggers.  Try to eat healthy balanced meals.  Restart Topamax to use at bedtime.  We will give Imitrex to use for abortive therapy.   Went over how to take the medicine. - topiramate (TOPAMAX) 25 MG tablet; 1 tab PO QHS  Dispense: 30 tablet; Refill: 1 - SUMAtriptan (IMITREX) 50 MG tablet; 1 tab PO at start headache.  May repeat in 2 hrs PRN.  Max 2 tabs/24 hrs  Dispense: 10 tablet; Refill:  1  2. Bilateral chronic knee pain We will prescribe some ibuprofen as needed and refer to orthopedics. - ibuprofen (ADVIL) 800 MG tablet; Take 1 tablet (800 mg total) by mouth every 8 (eight) hours as needed (take with food).  Dispense: 60 tablet; Refill: 0 - Ambulatory referral to Orthopedic Surgery  3. Gastroenteritis Resolved.  Note given for work.     Patient was given the opportunity to ask questions.  Patient verbalized understanding of the plan and was able to repeat key elements of the plan.   This documentation was completed using Radio producer.  Any transcriptional errors are unintentional.  No orders of the defined types were placed in this encounter.    Requested Prescriptions    No prescriptions requested or ordered in this encounter    No follow-ups on file.  Karle Plumber, MD, FACP

## 2022-07-05 ENCOUNTER — Ambulatory Visit: Payer: Medicaid Other | Admitting: Orthopaedic Surgery

## 2022-07-06 ENCOUNTER — Telehealth: Payer: Self-pay | Admitting: Internal Medicine

## 2022-07-06 NOTE — Telephone Encounter (Signed)
General 07/06/2022  8:21 AM Alden Hipp: Referral message -  Note: ----- Message ----- From: Dionne Bucy Sent: 07/06/2022   8:21 AM EST To: Marcine Matar, MD Subject: Orthopedics Referral                            Good morning   Dr  Laural Benes     ----- Message ----- From: Mendel Corning Sent: 07/05/2022   4:38 PM EST To: Dionne Bucy   Thanks for trusting the care of your patient to our office. The provider would like to make you aware your patient was a NO SHOW for their appointment on 07/05/22. At this time we will not reach out to the patient to reschedule, we will close the referral.        Bonnita Nasuti Referral Coordinator [place] [phone]

## 2022-07-29 DEATH — deceased

## 2022-08-16 ENCOUNTER — Ambulatory Visit: Payer: Medicaid Other | Admitting: Internal Medicine
